# Patient Record
Sex: Female | Born: 1960 | Race: White | Hispanic: No | Marital: Single | Smoking: Never smoker
Health system: Southern US, Community
[De-identification: ages and names within clinical notes are randomized; demographics above are authoritative.]

## PROBLEM LIST (undated history)

## (undated) DIAGNOSIS — J45909 Unspecified asthma, uncomplicated: Secondary | ICD-10-CM

## (undated) DIAGNOSIS — D6851 Activated protein C resistance: Secondary | ICD-10-CM

## (undated) DIAGNOSIS — Q379 Unspecified cleft palate with unilateral cleft lip: Secondary | ICD-10-CM

## (undated) DIAGNOSIS — Z9889 Other specified postprocedural states: Secondary | ICD-10-CM

## (undated) DIAGNOSIS — T8859XA Other complications of anesthesia, initial encounter: Secondary | ICD-10-CM

## (undated) HISTORY — PX: OTHER SURGICAL HISTORY: SHX169

## (undated) HISTORY — PX: MASTECTOMY: SHX3

## (undated) HISTORY — PX: TONSILLECTOMY: SUR1361

---

## 1998-07-29 ENCOUNTER — Ambulatory Visit (HOSPITAL_COMMUNITY): Admission: RE | Admit: 1998-07-29 | Discharge: 1998-07-29 | Payer: Self-pay | Admitting: *Deleted

## 1998-07-29 ENCOUNTER — Encounter: Payer: Self-pay | Admitting: *Deleted

## 1999-12-17 ENCOUNTER — Other Ambulatory Visit: Admission: RE | Admit: 1999-12-17 | Discharge: 1999-12-17 | Payer: Self-pay | Admitting: Obstetrics and Gynecology

## 2000-07-01 ENCOUNTER — Inpatient Hospital Stay (HOSPITAL_COMMUNITY): Admission: AD | Admit: 2000-07-01 | Discharge: 2000-07-04 | Payer: Self-pay | Admitting: Obstetrics and Gynecology

## 2000-07-05 ENCOUNTER — Encounter: Admission: RE | Admit: 2000-07-05 | Discharge: 2000-08-06 | Payer: Self-pay | Admitting: Obstetrics and Gynecology

## 2000-08-11 ENCOUNTER — Other Ambulatory Visit: Admission: RE | Admit: 2000-08-11 | Discharge: 2000-08-11 | Payer: Self-pay | Admitting: Obstetrics and Gynecology

## 2002-12-29 ENCOUNTER — Ambulatory Visit (HOSPITAL_COMMUNITY): Admission: RE | Admit: 2002-12-29 | Discharge: 2002-12-29 | Payer: Self-pay | Admitting: *Deleted

## 2002-12-29 ENCOUNTER — Encounter: Payer: Self-pay | Admitting: *Deleted

## 2004-05-08 ENCOUNTER — Ambulatory Visit (HOSPITAL_BASED_OUTPATIENT_CLINIC_OR_DEPARTMENT_OTHER): Admission: RE | Admit: 2004-05-08 | Discharge: 2004-05-08 | Payer: Self-pay | Admitting: Family Medicine

## 2004-10-14 ENCOUNTER — Other Ambulatory Visit: Admission: RE | Admit: 2004-10-14 | Discharge: 2004-10-14 | Payer: Self-pay | Admitting: Family Medicine

## 2006-06-22 ENCOUNTER — Encounter: Admission: RE | Admit: 2006-06-22 | Discharge: 2006-06-22 | Payer: Self-pay | Admitting: Family Medicine

## 2006-07-01 ENCOUNTER — Encounter: Admission: RE | Admit: 2006-07-01 | Discharge: 2006-07-01 | Payer: Self-pay | Admitting: Family Medicine

## 2007-01-12 ENCOUNTER — Other Ambulatory Visit: Admission: RE | Admit: 2007-01-12 | Discharge: 2007-01-12 | Payer: Self-pay | Admitting: Family Medicine

## 2008-12-12 ENCOUNTER — Encounter: Admission: RE | Admit: 2008-12-12 | Discharge: 2008-12-12 | Payer: Self-pay | Admitting: Family Medicine

## 2008-12-12 ENCOUNTER — Encounter (INDEPENDENT_AMBULATORY_CARE_PROVIDER_SITE_OTHER): Payer: Self-pay | Admitting: Diagnostic Radiology

## 2008-12-20 ENCOUNTER — Encounter: Admission: RE | Admit: 2008-12-20 | Discharge: 2008-12-20 | Payer: Self-pay | Admitting: Family Medicine

## 2008-12-24 ENCOUNTER — Ambulatory Visit: Payer: Self-pay | Admitting: Genetic Counselor

## 2009-01-29 ENCOUNTER — Other Ambulatory Visit: Admission: RE | Admit: 2009-01-29 | Discharge: 2009-01-29 | Payer: Self-pay | Admitting: Family Medicine

## 2009-02-04 ENCOUNTER — Encounter (INDEPENDENT_AMBULATORY_CARE_PROVIDER_SITE_OTHER): Payer: Self-pay | Admitting: Surgery

## 2009-02-04 ENCOUNTER — Ambulatory Visit (HOSPITAL_BASED_OUTPATIENT_CLINIC_OR_DEPARTMENT_OTHER): Admission: RE | Admit: 2009-02-04 | Discharge: 2009-02-05 | Payer: Self-pay | Admitting: Surgery

## 2009-02-14 ENCOUNTER — Emergency Department (HOSPITAL_COMMUNITY): Admission: EM | Admit: 2009-02-14 | Discharge: 2009-02-15 | Payer: Self-pay | Admitting: Emergency Medicine

## 2009-02-20 ENCOUNTER — Encounter: Payer: Self-pay | Admitting: Internal Medicine

## 2009-02-20 ENCOUNTER — Ambulatory Visit: Payer: Self-pay | Admitting: Oncology

## 2009-02-20 LAB — CBC WITH DIFFERENTIAL/PLATELET
Basophils Absolute: 0 10*3/uL (ref 0.0–0.1)
Eosinophils Absolute: 0.1 10*3/uL (ref 0.0–0.5)
HGB: 14.8 g/dL (ref 11.6–15.9)
NEUT#: 4 10*3/uL (ref 1.5–6.5)
RDW: 13.4 % (ref 11.2–14.5)
lymph#: 1.2 10*3/uL (ref 0.9–3.3)

## 2009-02-21 ENCOUNTER — Ambulatory Visit: Payer: Self-pay

## 2009-02-21 ENCOUNTER — Encounter: Payer: Self-pay | Admitting: Oncology

## 2009-02-21 ENCOUNTER — Ambulatory Visit (HOSPITAL_COMMUNITY): Admission: RE | Admit: 2009-02-21 | Discharge: 2009-02-21 | Payer: Self-pay | Admitting: Oncology

## 2009-02-21 LAB — COMPREHENSIVE METABOLIC PANEL
Albumin: 3.9 g/dL (ref 3.5–5.2)
BUN: 9 mg/dL (ref 6–23)
CO2: 27 mEq/L (ref 19–32)
Glucose, Bld: 92 mg/dL (ref 70–99)
Potassium: 3.9 mEq/L (ref 3.5–5.3)
Sodium: 143 mEq/L (ref 135–145)
Total Bilirubin: 0.5 mg/dL (ref 0.3–1.2)
Total Protein: 6.5 g/dL (ref 6.0–8.3)

## 2009-02-21 LAB — LACTATE DEHYDROGENASE: LDH: 177 U/L (ref 94–250)

## 2009-02-21 LAB — VITAMIN D 25 HYDROXY (VIT D DEFICIENCY, FRACTURES): Vit D, 25-Hydroxy: 27 ng/mL — ABNORMAL LOW (ref 30–89)

## 2009-02-21 LAB — CANCER ANTIGEN 27.29: CA 27.29: 19 U/mL (ref 0–39)

## 2009-03-08 LAB — HYPERCOAGULABLE PANEL, COMPREHENSIVE RET.
AntiThromb III Func: 95 % (ref 76–126)
Beta-2 Glyco I IgG: <4 U/mL (ref <20)
Beta-2-Glycoprotein I IgA: 13 U/mL (ref <10)
Beta-2-Glycoprotein I IgM: <4 U/mL (ref <10)
Homocysteine: 10.4 umol/L (ref 4.0–15.4)
PTT Lupus Anticoagulant: 47 secs (ref 36.3–48.8)
Protein S Activity: 62 % — ABNORMAL LOW (ref 69–129)
Protein S Ag, Total: 78 % (ref 70–140)

## 2009-03-08 LAB — LIPID PANEL
Cholesterol: 156 mg/dL (ref 0–200)
LDL Cholesterol: 81 mg/dL (ref 0–99)
Triglycerides: 38 mg/dL (ref <150)

## 2009-03-11 ENCOUNTER — Ambulatory Visit (HOSPITAL_BASED_OUTPATIENT_CLINIC_OR_DEPARTMENT_OTHER): Admission: RE | Admit: 2009-03-11 | Discharge: 2009-03-11 | Payer: Self-pay | Admitting: Surgery

## 2009-03-21 LAB — URINALYSIS, MICROSCOPIC - CHCC
Bilirubin (Urine): NEGATIVE
Ketones: NEGATIVE mg/dL
Leukocyte Esterase: NEGATIVE
pH: 6 (ref 4.6–8.0)

## 2009-03-21 LAB — CBC WITH DIFFERENTIAL/PLATELET
Basophils Absolute: 0 10*3/uL (ref 0.0–0.1)
EOS%: 12.7 % — ABNORMAL HIGH (ref 0.0–7.0)
HCT: 39.8 % (ref 34.8–46.6)
HGB: 13.9 g/dL (ref 11.6–15.9)
MCH: 33.1 pg (ref 25.1–34.0)
MCV: 94.8 fL (ref 79.5–101.0)
NEUT%: 29.9 % — ABNORMAL LOW (ref 38.4–76.8)
Platelets: 127 10*3/uL — ABNORMAL LOW (ref 145–400)
lymph#: 0.7 10*3/uL — ABNORMAL LOW (ref 0.9–3.3)

## 2009-03-23 LAB — URINE CULTURE

## 2009-03-26 ENCOUNTER — Ambulatory Visit: Payer: Self-pay | Admitting: Oncology

## 2009-03-28 LAB — CBC WITH DIFFERENTIAL/PLATELET
BASO%: 0.4 % (ref 0.0–2.0)
LYMPH%: 29.4 % (ref 14.0–49.7)
MCHC: 35.1 g/dL (ref 31.5–36.0)
MONO#: 0.5 10*3/uL (ref 0.1–0.9)
MONO%: 9.2 % (ref 0.0–14.0)
NEUT#: 3.2 10*3/uL (ref 1.5–6.5)
Platelets: 294 10*3/uL (ref 145–400)
RBC: 3.89 10*6/uL (ref 3.70–5.45)
RDW: 13.4 % (ref 11.2–14.5)
WBC: 5.4 10*3/uL (ref 3.9–10.3)

## 2009-04-04 LAB — CBC WITH DIFFERENTIAL/PLATELET
BASO%: 1 % (ref 0.0–2.0)
Eosinophils Absolute: 0.1 10*3/uL (ref 0.0–0.5)
MCHC: 34.6 g/dL (ref 31.5–36.0)
MONO#: 0.2 10*3/uL (ref 0.1–0.9)
NEUT#: 1.4 10*3/uL — ABNORMAL LOW (ref 1.5–6.5)
RBC: 3.6 10*6/uL — ABNORMAL LOW (ref 3.70–5.45)
WBC: 2.6 10*3/uL — ABNORMAL LOW (ref 3.9–10.3)
lymph#: 0.9 10*3/uL (ref 0.9–3.3)

## 2009-04-11 LAB — CBC WITH DIFFERENTIAL/PLATELET
BASO%: 1.5 % (ref 0.0–2.0)
LYMPH%: 20.8 % (ref 14.0–49.7)
MCHC: 35.1 g/dL (ref 31.5–36.0)
MONO#: 1 10*3/uL — ABNORMAL HIGH (ref 0.1–0.9)
Platelets: 232 10*3/uL (ref 145–400)
RBC: 4.05 10*6/uL (ref 3.70–5.45)
RDW: 13.6 % (ref 11.2–14.5)
WBC: 9 10*3/uL (ref 3.9–10.3)
lymph#: 1.9 10*3/uL (ref 0.9–3.3)

## 2009-04-11 LAB — COMPREHENSIVE METABOLIC PANEL
ALT: 32 U/L (ref 0–35)
Albumin: 3.6 g/dL (ref 3.5–5.2)
CO2: 28 mEq/L (ref 19–32)
Calcium: 9 mg/dL (ref 8.4–10.5)
Chloride: 107 mEq/L (ref 96–112)
Potassium: 4.1 mEq/L (ref 3.5–5.3)
Sodium: 141 mEq/L (ref 135–145)
Total Protein: 6.3 g/dL (ref 6.0–8.3)

## 2009-04-18 ENCOUNTER — Ambulatory Visit: Payer: Self-pay | Admitting: Oncology

## 2009-04-18 LAB — CBC WITH DIFFERENTIAL/PLATELET
BASO%: 0.7 % (ref 0.0–2.0)
HCT: 36.2 % (ref 34.8–46.6)
MCHC: 35.1 g/dL (ref 31.5–36.0)
MONO#: 0.3 10*3/uL (ref 0.1–0.9)
NEUT#: 4.4 10*3/uL (ref 1.5–6.5)
NEUT%: 75.6 % (ref 38.4–76.8)
WBC: 5.8 10*3/uL (ref 3.9–10.3)
lymph#: 1.1 10*3/uL (ref 0.9–3.3)
nRBC: 0 % (ref 0–0)

## 2009-04-25 LAB — CBC WITH DIFFERENTIAL/PLATELET
BASO%: 1.9 % (ref 0.0–2.0)
EOS%: 0.3 % (ref 0.0–7.0)
MCH: 33.1 pg (ref 25.1–34.0)
MCHC: 34.9 g/dL (ref 31.5–36.0)
NEUT%: 65.3 % (ref 38.4–76.8)
RBC: 3.9 10*6/uL (ref 3.70–5.45)
RDW: 15.4 % — ABNORMAL HIGH (ref 11.2–14.5)
lymph#: 1.3 10*3/uL (ref 0.9–3.3)

## 2009-04-25 LAB — COMPREHENSIVE METABOLIC PANEL
ALT: 33 U/L (ref 0–35)
AST: 30 U/L (ref 0–37)
Calcium: 8.9 mg/dL (ref 8.4–10.5)
Chloride: 108 mEq/L (ref 96–112)
Creatinine, Ser: 0.69 mg/dL (ref 0.40–1.20)
Potassium: 3.8 mEq/L (ref 3.5–5.3)
Sodium: 138 mEq/L (ref 135–145)

## 2009-05-02 LAB — CBC WITH DIFFERENTIAL/PLATELET
BASO%: 1.9 % (ref 0.0–2.0)
HCT: 36.6 % (ref 34.8–46.6)
MCHC: 35.2 g/dL (ref 31.5–36.0)
MONO#: 0.1 10*3/uL (ref 0.1–0.9)
RBC: 3.89 10*6/uL (ref 3.70–5.45)
RDW: 15.2 % — ABNORMAL HIGH (ref 11.2–14.5)
WBC: 2.2 10*3/uL — ABNORMAL LOW (ref 3.9–10.3)
lymph#: 0.8 10*3/uL — ABNORMAL LOW (ref 0.9–3.3)

## 2009-05-09 LAB — COMPREHENSIVE METABOLIC PANEL
ALT: 30 U/L (ref 0–35)
Albumin: 3.5 g/dL (ref 3.5–5.2)
BUN: 10 mg/dL (ref 6–23)
CO2: 28 mEq/L (ref 19–32)
Calcium: 9.1 mg/dL (ref 8.4–10.5)
Chloride: 104 mEq/L (ref 96–112)
Creatinine, Ser: 0.65 mg/dL (ref 0.40–1.20)
Potassium: 3.7 mEq/L (ref 3.5–5.3)

## 2009-05-09 LAB — CBC WITH DIFFERENTIAL/PLATELET
Basophils Absolute: 0.1 10*3/uL (ref 0.0–0.1)
HCT: 34.3 % — ABNORMAL LOW (ref 34.8–46.6)
HGB: 11.8 g/dL (ref 11.6–15.9)
MONO#: 0.7 10*3/uL (ref 0.1–0.9)
NEUT#: 3.9 10*3/uL (ref 1.5–6.5)
NEUT%: 68.4 % (ref 38.4–76.8)
WBC: 5.7 10*3/uL (ref 3.9–10.3)
lymph#: 0.9 10*3/uL (ref 0.9–3.3)

## 2009-05-14 ENCOUNTER — Ambulatory Visit: Payer: Self-pay | Admitting: Oncology

## 2009-05-16 LAB — CBC WITH DIFFERENTIAL/PLATELET
BASO%: 1.8 % (ref 0.0–2.0)
EOS%: 1.1 % (ref 0.0–7.0)
HCT: 35.2 % (ref 34.8–46.6)
MCHC: 34.4 g/dL (ref 31.5–36.0)
MONO#: 0.6 10*3/uL (ref 0.1–0.9)
NEUT%: 63 % (ref 38.4–76.8)
RDW: 16.8 % — ABNORMAL HIGH (ref 11.2–14.5)
WBC: 4.5 10*3/uL (ref 3.9–10.3)
lymph#: 1 10*3/uL (ref 0.9–3.3)
nRBC: 0 % (ref 0–0)

## 2009-05-23 ENCOUNTER — Encounter: Admission: RE | Admit: 2009-05-23 | Discharge: 2009-05-23 | Payer: Self-pay | Admitting: Oncology

## 2009-05-23 LAB — CBC WITH DIFFERENTIAL/PLATELET
BASO%: 3.2 % — ABNORMAL HIGH (ref 0.0–2.0)
Basophils Absolute: 0.1 10*3/uL (ref 0.0–0.1)
EOS%: 0.9 % (ref 0.0–7.0)
HGB: 12.5 g/dL (ref 11.6–15.9)
MCH: 34.1 pg — ABNORMAL HIGH (ref 25.1–34.0)
MCHC: 34.9 g/dL (ref 31.5–36.0)
MCV: 97.5 fL (ref 79.5–101.0)
MONO%: 11.3 % (ref 0.0–14.0)
RBC: 3.67 10*6/uL — ABNORMAL LOW (ref 3.70–5.45)
RDW: 17.4 % — ABNORMAL HIGH (ref 11.2–14.5)
lymph#: 1.1 10*3/uL (ref 0.9–3.3)

## 2009-05-30 LAB — CBC WITH DIFFERENTIAL/PLATELET
Basophils Absolute: 0.1 10*3/uL (ref 0.0–0.1)
Eosinophils Absolute: 0 10*3/uL (ref 0.0–0.5)
HCT: 34.9 % (ref 34.8–46.6)
HGB: 12.4 g/dL (ref 11.6–15.9)
LYMPH%: 28.2 % (ref 14.0–49.7)
MCV: 100.1 fL (ref 79.5–101.0)
MONO#: 0.3 10*3/uL (ref 0.1–0.9)
MONO%: 9.2 % (ref 0.0–14.0)
NEUT#: 2.1 10*3/uL (ref 1.5–6.5)
NEUT%: 60 % (ref 38.4–76.8)
Platelets: 259 10*3/uL (ref 145–400)

## 2009-05-31 LAB — COMPREHENSIVE METABOLIC PANEL
Alkaline Phosphatase: 55 U/L (ref 39–117)
BUN: 12 mg/dL (ref 6–23)
CO2: 26 mEq/L (ref 19–32)
Glucose, Bld: 89 mg/dL (ref 70–99)
Total Bilirubin: 0.3 mg/dL (ref 0.3–1.2)

## 2009-06-06 ENCOUNTER — Ambulatory Visit (HOSPITAL_COMMUNITY): Admission: RE | Admit: 2009-06-06 | Discharge: 2009-06-06 | Payer: Self-pay | Admitting: Oncology

## 2009-06-06 LAB — CBC WITH DIFFERENTIAL/PLATELET
Basophils Absolute: 0.1 10*3/uL (ref 0.0–0.1)
Eosinophils Absolute: 0.2 10*3/uL (ref 0.0–0.5)
HGB: 12.5 g/dL (ref 11.6–15.9)
MCV: 98.1 fL (ref 79.5–101.0)
MONO#: 0.4 10*3/uL (ref 0.1–0.9)
MONO%: 10.4 % (ref 0.0–14.0)
NEUT#: 2.3 10*3/uL (ref 1.5–6.5)
Platelets: 240 10*3/uL (ref 145–400)
RBC: 3.62 10*6/uL — ABNORMAL LOW (ref 3.70–5.45)
RDW: 16.4 % — ABNORMAL HIGH (ref 11.2–14.5)
WBC: 4.2 10*3/uL (ref 3.9–10.3)

## 2009-06-13 ENCOUNTER — Ambulatory Visit: Payer: Self-pay | Admitting: Oncology

## 2009-06-13 LAB — CBC WITH DIFFERENTIAL/PLATELET
Basophils Absolute: 0.1 10*3/uL (ref 0.0–0.1)
Eosinophils Absolute: 0.2 10*3/uL (ref 0.0–0.5)
HCT: 36.8 % (ref 34.8–46.6)
LYMPH%: 34.8 % (ref 14.0–49.7)
MCV: 98.9 fL (ref 79.5–101.0)
MONO#: 0.4 10*3/uL (ref 0.1–0.9)
MONO%: 10.2 % (ref 0.0–14.0)
NEUT#: 1.8 10*3/uL (ref 1.5–6.5)
NEUT%: 47.8 % (ref 38.4–76.8)
Platelets: 251 10*3/uL (ref 145–400)
RBC: 3.72 10*6/uL (ref 3.70–5.45)
WBC: 3.7 10*3/uL — ABNORMAL LOW (ref 3.9–10.3)

## 2009-06-13 LAB — COMPREHENSIVE METABOLIC PANEL
ALT: 69 U/L — ABNORMAL HIGH (ref 0–35)
BUN: 11 mg/dL (ref 6–23)
CO2: 27 mEq/L (ref 19–32)
Calcium: 9 mg/dL (ref 8.4–10.5)
Chloride: 109 mEq/L (ref 96–112)
Creatinine, Ser: 0.51 mg/dL (ref 0.40–1.20)
Glucose, Bld: 94 mg/dL (ref 70–99)
Total Bilirubin: 0.5 mg/dL (ref 0.3–1.2)

## 2009-06-20 LAB — CBC WITH DIFFERENTIAL/PLATELET
BASO%: 1.7 % (ref 0.0–2.0)
Eosinophils Absolute: 0.2 10*3/uL (ref 0.0–0.5)
HCT: 35.5 % (ref 34.8–46.6)
HGB: 12.4 g/dL (ref 11.6–15.9)
MCHC: 34.9 g/dL (ref 31.5–36.0)
MONO#: 0.3 10*3/uL (ref 0.1–0.9)
NEUT#: 1.8 10*3/uL (ref 1.5–6.5)
NEUT%: 49.1 % (ref 38.4–76.8)
Platelets: 214 10*3/uL (ref 145–400)
WBC: 3.6 10*3/uL — ABNORMAL LOW (ref 3.9–10.3)
lymph#: 1.3 10*3/uL (ref 0.9–3.3)

## 2009-06-27 LAB — CBC WITH DIFFERENTIAL/PLATELET
BASO%: 1.7 % (ref 0.0–2.0)
EOS%: 6.5 % (ref 0.0–7.0)
LYMPH%: 33.6 % (ref 14.0–49.7)
MCHC: 35.8 g/dL (ref 31.5–36.0)
MONO#: 0.3 10*3/uL (ref 0.1–0.9)
MONO%: 8.2 % (ref 0.0–14.0)
Platelets: 239 10*3/uL (ref 145–400)
RBC: 3.39 10*6/uL — ABNORMAL LOW (ref 3.70–5.45)
WBC: 3.4 10*3/uL — ABNORMAL LOW (ref 3.9–10.3)

## 2009-07-02 ENCOUNTER — Ambulatory Visit: Admission: RE | Admit: 2009-07-02 | Discharge: 2009-09-27 | Payer: Self-pay | Admitting: Radiation Oncology

## 2009-07-04 LAB — CBC WITH DIFFERENTIAL/PLATELET
BASO%: 2.9 % — ABNORMAL HIGH (ref 0.0–2.0)
LYMPH%: 32.1 % (ref 14.0–49.7)
MCHC: 34.6 g/dL (ref 31.5–36.0)
MONO#: 0.4 10*3/uL (ref 0.1–0.9)
RBC: 3.47 10*6/uL — ABNORMAL LOW (ref 3.70–5.45)
WBC: 3.4 10*3/uL — ABNORMAL LOW (ref 3.9–10.3)
lymph#: 1.1 10*3/uL (ref 0.9–3.3)
nRBC: 0 % (ref 0–0)

## 2009-07-10 LAB — CBC WITH DIFFERENTIAL/PLATELET
Basophils Absolute: 0 10*3/uL (ref 0.0–0.1)
EOS%: 2.1 % (ref 0.0–7.0)
HGB: 12.7 g/dL (ref 11.6–15.9)
MCH: 36 pg — ABNORMAL HIGH (ref 25.1–34.0)
NEUT#: 1.7 10*3/uL (ref 1.5–6.5)
RBC: 3.53 10*6/uL — ABNORMAL LOW (ref 3.70–5.45)
RDW: 13.4 % (ref 11.2–14.5)
lymph#: 0.8 10*3/uL — ABNORMAL LOW (ref 0.9–3.3)

## 2009-07-16 ENCOUNTER — Ambulatory Visit: Payer: Self-pay | Admitting: Oncology

## 2009-07-18 ENCOUNTER — Ambulatory Visit (HOSPITAL_COMMUNITY): Admission: RE | Admit: 2009-07-18 | Discharge: 2009-07-18 | Payer: Self-pay | Admitting: Oncology

## 2009-07-18 LAB — CBC WITH DIFFERENTIAL/PLATELET
BASO%: 1 % (ref 0.0–2.0)
EOS%: 4 % (ref 0.0–7.0)
LYMPH%: 39.1 % (ref 14.0–49.7)
MCH: 35.7 pg — ABNORMAL HIGH (ref 25.1–34.0)
MCHC: 34.4 g/dL (ref 31.5–36.0)
MCV: 103.6 fL — ABNORMAL HIGH (ref 79.5–101.0)
MONO%: 13.4 % (ref 0.0–14.0)
NEUT#: 1.3 10*3/uL — ABNORMAL LOW (ref 1.5–6.5)
Platelets: 254 10*3/uL (ref 145–400)
RBC: 3.59 10*6/uL — ABNORMAL LOW (ref 3.70–5.45)
RDW: 13.6 % (ref 11.2–14.5)

## 2009-07-18 LAB — COMPREHENSIVE METABOLIC PANEL
ALT: 52 U/L — ABNORMAL HIGH (ref 0–35)
AST: 35 U/L (ref 0–37)
Albumin: 4.1 g/dL (ref 3.5–5.2)
BUN: 11 mg/dL (ref 6–23)
Calcium: 9.3 mg/dL (ref 8.4–10.5)
Chloride: 109 mEq/L (ref 96–112)
Potassium: 4.4 mEq/L (ref 3.5–5.3)
Sodium: 141 mEq/L (ref 135–145)
Total Protein: 6.3 g/dL (ref 6.0–8.3)

## 2009-07-25 LAB — CBC WITH DIFFERENTIAL/PLATELET
BASO%: 0.9 % (ref 0.0–2.0)
Eosinophils Absolute: 0.2 10*3/uL (ref 0.0–0.5)
MCHC: 34.7 g/dL (ref 31.5–36.0)
MONO#: 0.8 10*3/uL (ref 0.1–0.9)
MONO%: 18.4 % — ABNORMAL HIGH (ref 0.0–14.0)
NEUT#: 1.9 10*3/uL (ref 1.5–6.5)
RBC: 3.97 10*6/uL (ref 3.70–5.45)
RDW: 12.7 % (ref 11.2–14.5)
WBC: 4.3 10*3/uL (ref 3.9–10.3)
nRBC: 0 % (ref 0–0)

## 2009-08-07 ENCOUNTER — Encounter: Admission: RE | Admit: 2009-08-07 | Discharge: 2009-09-18 | Payer: Self-pay | Admitting: Oncology

## 2009-09-29 ENCOUNTER — Ambulatory Visit: Admission: RE | Admit: 2009-09-29 | Discharge: 2009-10-04 | Payer: Self-pay | Admitting: Radiation Oncology

## 2009-09-30 ENCOUNTER — Ambulatory Visit: Payer: Self-pay | Admitting: Oncology

## 2009-09-30 LAB — CBC WITH DIFFERENTIAL/PLATELET
BASO%: 0.5 % (ref 0.0–2.0)
LYMPH%: 20.9 % (ref 14.0–49.7)
MCHC: 34.9 g/dL (ref 31.5–36.0)
MCV: 97.5 fL (ref 79.5–101.0)
MONO#: 0.7 10*3/uL (ref 0.1–0.9)
MONO%: 14.2 % — ABNORMAL HIGH (ref 0.0–14.0)
NEUT#: 2.9 10*3/uL (ref 1.5–6.5)
NEUT%: 61.9 % (ref 38.4–76.8)
WBC: 4.7 10*3/uL (ref 3.9–10.3)
lymph#: 1 10*3/uL (ref 0.9–3.3)

## 2009-09-30 LAB — CANCER ANTIGEN 19-9: CA 19-9: 18.4 U/mL (ref <35.0)

## 2009-09-30 LAB — CANCER ANTIGEN 27.29: CA 27.29: 7 U/mL (ref 0–39)

## 2009-09-30 LAB — COMPREHENSIVE METABOLIC PANEL
BUN: 10 mg/dL (ref 6–23)
Chloride: 102 mEq/L (ref 96–112)
Potassium: 4.1 mEq/L (ref 3.5–5.3)
Total Protein: 6.9 g/dL (ref 6.0–8.3)

## 2009-11-11 ENCOUNTER — Other Ambulatory Visit: Admission: RE | Admit: 2009-11-11 | Discharge: 2009-11-11 | Payer: Self-pay | Admitting: Family Medicine

## 2009-11-19 ENCOUNTER — Ambulatory Visit (HOSPITAL_BASED_OUTPATIENT_CLINIC_OR_DEPARTMENT_OTHER): Admission: RE | Admit: 2009-11-19 | Discharge: 2009-11-19 | Payer: Self-pay | Admitting: Surgery

## 2010-02-12 ENCOUNTER — Ambulatory Visit: Payer: Self-pay | Admitting: Oncology

## 2010-02-13 ENCOUNTER — Ambulatory Visit (HOSPITAL_COMMUNITY): Admission: RE | Admit: 2010-02-13 | Discharge: 2010-02-13 | Payer: Self-pay | Admitting: Oncology

## 2010-02-13 LAB — CBC WITH DIFFERENTIAL/PLATELET
BASO%: 0.6 % (ref 0.0–2.0)
EOS%: 3.3 % (ref 0.0–7.0)
HGB: 15.4 g/dL (ref 11.6–15.9)
MCH: 33.5 pg (ref 25.1–34.0)
MCHC: 34.6 g/dL (ref 31.5–36.0)
MCV: 96.6 fL (ref 79.5–101.0)
MONO%: 8 % (ref 0.0–14.0)
RBC: 4.61 10*6/uL (ref 3.70–5.45)
RDW: 13 % (ref 11.2–14.5)
lymph#: 1.6 10*3/uL (ref 0.9–3.3)

## 2010-02-13 LAB — COMPREHENSIVE METABOLIC PANEL
ALT: 53 U/L — ABNORMAL HIGH (ref 0–35)
AST: 46 U/L — ABNORMAL HIGH (ref 0–37)
Albumin: 4.1 g/dL (ref 3.5–5.2)
Alkaline Phosphatase: 85 U/L (ref 39–117)
BUN: 16 mg/dL (ref 6–23)
Calcium: 9.6 mg/dL (ref 8.4–10.5)
Chloride: 108 mEq/L (ref 96–112)
Potassium: 3.9 mEq/L (ref 3.5–5.3)
Sodium: 142 mEq/L (ref 135–145)

## 2010-02-19 IMAGING — CR DG CHEST 1V PORT
1 series · 1 of 1 positions shown · non-contrast
Comparison: 02/14/2009

CLINICAL DATA: Breast cancer.  Status post Port-A-Cath placement.

PORTABLE CHEST - 1 VIEW

[view not recorded]
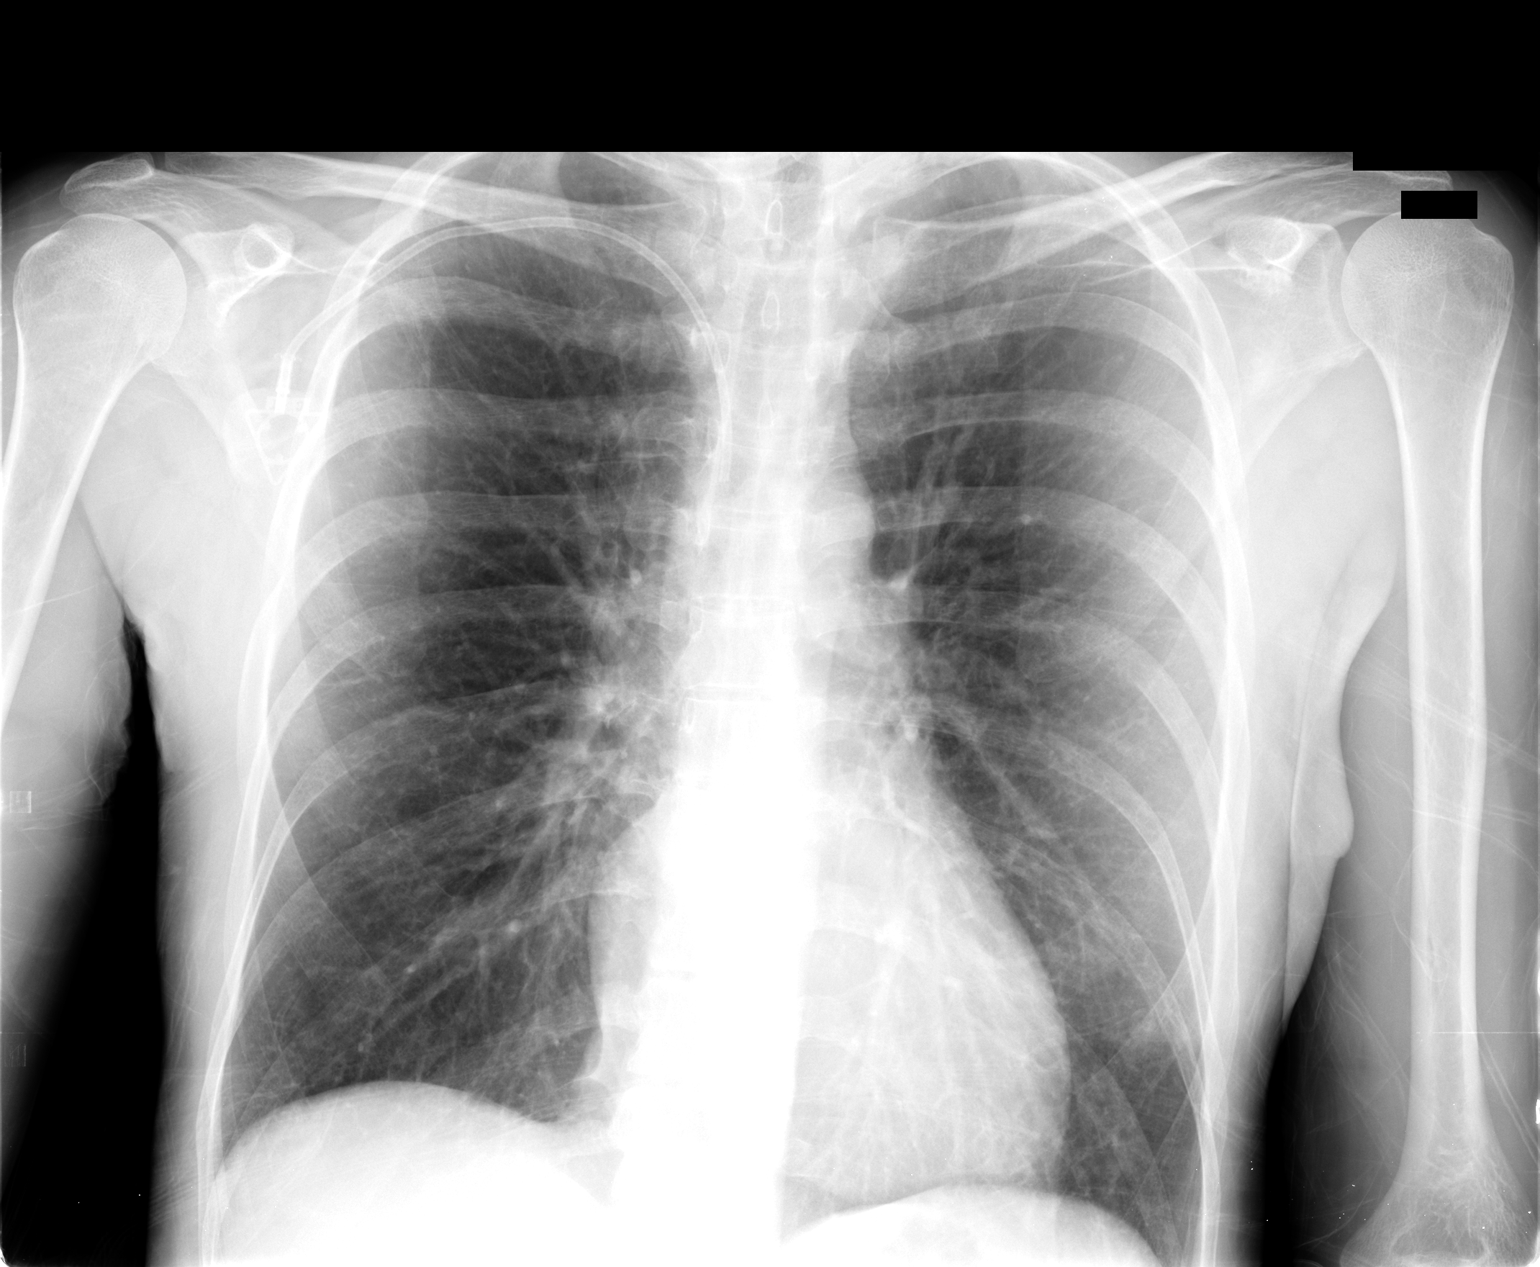

[1 of 1 positions shown; findings below may reference images not displayed]

FINDINGS: A new right-sided Port-A-Cath is seen with tip in the
SVC.  There is no evidence of pneumothorax.

Both lungs are clear.  Heart size and mediastinal contours are
normal.
IMPRESSION: Right-sided Port-A-Cath in appropriate position.  No evidence of
pneumothorax or other acute findings.

## 2010-05-13 ENCOUNTER — Ambulatory Visit: Payer: Self-pay | Admitting: Oncology

## 2010-05-13 LAB — COMPREHENSIVE METABOLIC PANEL
AST: 35 U/L (ref 0–37)
Albumin: 4.2 g/dL (ref 3.5–5.2)
Alkaline Phosphatase: 87 U/L (ref 39–117)
BUN: 11 mg/dL (ref 6–23)
Calcium: 9.5 mg/dL (ref 8.4–10.5)
Chloride: 105 mEq/L (ref 96–112)
Potassium: 4.3 mEq/L (ref 3.5–5.3)
Sodium: 140 mEq/L (ref 135–145)
Total Protein: 6.5 g/dL (ref 6.0–8.3)

## 2010-05-13 LAB — CBC WITH DIFFERENTIAL/PLATELET
BASO%: 0.7 % (ref 0.0–2.0)
Basophils Absolute: 0 10*3/uL (ref 0.0–0.1)
EOS%: 0.8 % (ref 0.0–7.0)
Eosinophils Absolute: 0 10*3/uL (ref 0.0–0.5)
LYMPH%: 30.1 % (ref 14.0–49.7)
MCH: 33.7 pg (ref 25.1–34.0)
MONO#: 0.4 10*3/uL (ref 0.1–0.9)
MONO%: 7.1 % (ref 0.0–14.0)
NEUT#: 3.2 10*3/uL (ref 1.5–6.5)
Platelets: 220 10*3/uL (ref 145–400)
RBC: 4.19 10*6/uL (ref 3.70–5.45)
WBC: 5.2 10*3/uL (ref 3.9–10.3)

## 2010-08-07 ENCOUNTER — Ambulatory Visit: Payer: Self-pay | Admitting: Oncology

## 2010-08-11 LAB — CBC WITH DIFFERENTIAL/PLATELET
Basophils Absolute: 0 10*3/uL (ref 0.0–0.1)
EOS%: 1.7 % (ref 0.0–7.0)
Eosinophils Absolute: 0.1 10*3/uL (ref 0.0–0.5)
LYMPH%: 30.9 % (ref 14.0–49.7)
MCH: 34 pg (ref 25.1–34.0)
MCV: 97.4 fL (ref 79.5–101.0)
MONO%: 6.8 % (ref 0.0–14.0)
NEUT#: 4.2 10*3/uL (ref 1.5–6.5)
Platelets: 248 10*3/uL (ref 145–400)
RBC: 4.22 10*6/uL (ref 3.70–5.45)
RDW: 12.9 % (ref 11.2–14.5)

## 2010-08-11 LAB — COMPREHENSIVE METABOLIC PANEL
AST: 35 U/L (ref 0–37)
Alkaline Phosphatase: 83 U/L (ref 39–117)
BUN: 16 mg/dL (ref 6–23)
Glucose, Bld: 92 mg/dL (ref 70–99)
Sodium: 141 mEq/L (ref 135–145)
Total Bilirubin: 0.3 mg/dL (ref 0.3–1.2)

## 2010-10-18 ENCOUNTER — Other Ambulatory Visit: Payer: Self-pay | Admitting: Oncology

## 2010-10-18 DIAGNOSIS — C50919 Malignant neoplasm of unspecified site of unspecified female breast: Secondary | ICD-10-CM

## 2010-10-18 DIAGNOSIS — K869 Disease of pancreas, unspecified: Secondary | ICD-10-CM

## 2010-10-20 ENCOUNTER — Encounter: Payer: Self-pay | Admitting: Surgery

## 2010-11-11 ENCOUNTER — Other Ambulatory Visit: Payer: Self-pay | Admitting: Oncology

## 2010-11-11 ENCOUNTER — Encounter (HOSPITAL_BASED_OUTPATIENT_CLINIC_OR_DEPARTMENT_OTHER): Payer: PRIVATE HEALTH INSURANCE | Admitting: Oncology

## 2010-11-11 DIAGNOSIS — C50419 Malignant neoplasm of upper-outer quadrant of unspecified female breast: Secondary | ICD-10-CM

## 2010-11-11 DIAGNOSIS — D6859 Other primary thrombophilia: Secondary | ICD-10-CM

## 2010-11-11 LAB — CBC WITH DIFFERENTIAL/PLATELET
BASO%: 0.8 % (ref 0.0–2.0)
EOS%: 2.6 % (ref 0.0–7.0)
Eosinophils Absolute: 0.1 10*3/uL (ref 0.0–0.5)
HGB: 14.8 g/dL (ref 11.6–15.9)
LYMPH%: 38.3 % (ref 14.0–49.7)
MCV: 99 fL (ref 79.5–101.0)
MONO#: 0.5 10*3/uL (ref 0.1–0.9)
NEUT#: 2.6 10*3/uL (ref 1.5–6.5)
NEUT%: 49.2 % (ref 38.4–76.8)

## 2010-11-11 LAB — COMPREHENSIVE METABOLIC PANEL
ALT: 46 U/L — ABNORMAL HIGH (ref 0–35)
CO2: 24 mEq/L (ref 19–32)
Chloride: 107 mEq/L (ref 96–112)
Creatinine, Ser: 0.7 mg/dL (ref 0.40–1.20)
Potassium: 4.2 mEq/L (ref 3.5–5.3)
Sodium: 143 mEq/L (ref 135–145)
Total Bilirubin: 0.4 mg/dL (ref 0.3–1.2)

## 2010-11-18 ENCOUNTER — Encounter (HOSPITAL_BASED_OUTPATIENT_CLINIC_OR_DEPARTMENT_OTHER): Payer: PRIVATE HEALTH INSURANCE | Admitting: Oncology

## 2010-11-18 DIAGNOSIS — C50419 Malignant neoplasm of upper-outer quadrant of unspecified female breast: Secondary | ICD-10-CM

## 2010-11-18 DIAGNOSIS — D6859 Other primary thrombophilia: Secondary | ICD-10-CM

## 2010-12-17 LAB — POCT HEMOGLOBIN-HEMACUE: Hemoglobin: 14.4 g/dL (ref 12.0–15.0)

## 2011-01-05 LAB — POCT HEMOGLOBIN-HEMACUE: Hemoglobin: 14.5 g/dL (ref 12.0–15.0)

## 2011-01-06 LAB — DIFFERENTIAL
Basophils Absolute: 0 10*3/uL (ref 0.0–0.1)
Basophils Relative: 0 % (ref 0–1)
Eosinophils Absolute: 0.1 10*3/uL (ref 0.0–0.7)
Eosinophils Relative: 1 % (ref 0–5)
Lymphocytes Relative: 11 % — ABNORMAL LOW (ref 12–46)
Monocytes Absolute: 1 10*3/uL (ref 0.1–1.0)

## 2011-01-06 LAB — CBC
HCT: 43.8 % (ref 36.0–46.0)
Hemoglobin: 15.3 g/dL — ABNORMAL HIGH (ref 12.0–15.0)
MCHC: 35 g/dL (ref 30.0–36.0)
MCV: 97.4 fL (ref 78.0–100.0)
RDW: 13 % (ref 11.5–15.5)

## 2011-01-06 LAB — POCT I-STAT, CHEM 8
BUN: 13 mg/dL (ref 6–23)
Calcium, Ion: 1.1 mmol/L — ABNORMAL LOW (ref 1.12–1.32)
Hemoglobin: 15.3 g/dL — ABNORMAL HIGH (ref 12.0–15.0)
TCO2: 25 mmol/L (ref 0–100)

## 2011-01-06 LAB — RAPID URINE DRUG SCREEN, HOSP PERFORMED
Amphetamines: NOT DETECTED
Barbiturates: NOT DETECTED

## 2011-02-09 ENCOUNTER — Ambulatory Visit
Admission: RE | Admit: 2011-02-09 | Discharge: 2011-02-09 | Disposition: A | Discharge status: Home / Self Care | Payer: PRIVATE HEALTH INSURANCE | Source: Ambulatory Visit | Attending: Oncology | Admitting: Oncology

## 2011-02-09 ENCOUNTER — Ambulatory Visit: Payer: Self-pay

## 2011-02-09 DIAGNOSIS — C50919 Malignant neoplasm of unspecified site of unspecified female breast: Secondary | ICD-10-CM

## 2011-02-09 DIAGNOSIS — K869 Disease of pancreas, unspecified: Secondary | ICD-10-CM

## 2011-02-09 MED ORDER — GADOBENATE DIMEGLUMINE 529 MG/ML IV SOLN: 12.0000 mL | Freq: Once | INTRAVENOUS | Status: DC | PRN

## 2011-02-10 NOTE — Op Note (Signed)
Elizabeth Dudley, Elizabeth Dudley               ACCOUNT NO.:  [id number]   MEDICAL RECORD NO.:  [id number]          PATIENT TYPE:  AMB   LOCATION:  DSC                          FACILITY:  [facility]   PHYSICIAN:  Thomas A. Cornett, M.D.DATE OF BIRTH:  1961/03/27   DATE OF PROCEDURE:  02/04/2009  DATE OF DISCHARGE:  02/04/2009                               OPERATIVE REPORT   PREOPERATIVE DIAGNOSIS:  1. Left breast cancer.  Strong family history of breast cancer.  2. History of bilateral breast implants.   POSTOPERATIVE DIAGNOSES:  1. Left breast cancer.  Strong family history of breast cancer.  2. History of bilateral breast implants.   PROCEDURES:  1. Left bilateral simple mastectomy.  2. Explantation of bilateral breast saline implants.  3. Left sentinel lymph node mapping with injection of methylene blue      dye.   SURGEON:  Maisie Fus A. Cornett, MD   ASSISTANT:  Angelia Mould. Derrell Lolling, MD   ANESTHESIA:  LMA.   ESTIMATED BLOOD LOSS:  50 mL.   SPECIMENS:  1. Left breast with tumor.  2. Four left axillary sentinel lymph nodes negative by touch prep.  3. Right breast.  4. Bilateral saline implants.   DRAINS:  #19 Blake drain to each side.   INDICATIONS FOR PROCEDURE:  The patient is a 50 year old female with a  strong family history of breast cancer, recently diagnosed with left  breast cancer.  She underwent genetic screening, which was negative for  any mutations in BRCA1 and 2, but she elected to undergo bilateral  mastectomies, given her strong family history and concern for  recurrence.  She also wished to have both implants removed at the same  time.  She presents for the above.   DESCRIPTION OF PROCEDURE:  The patient was brought to the operating room  after injection of technetium sulfur colloid by the radiologist.  After  induction of general anesthesia, the both breasts were prepped and  draped in sterile fashion.  The unaffected side was done first.  This  was the right side.   Curvilinear incisions were made above and below the  nipple areolar complex and superior and inferior skin flaps were created  up to the clavicle and down to the inframammary fold.  She had  significant scarring involving her inferior mammary fold from placement  of her implants.  Once we were able to develop these flaps, we then  removed the breast off the pectoralis major muscle in a medial to  lateral fashion.  The implants were easily seen.  We opened the  pectoralis major muscle, opened the capsule, and removed the right  implant, which was felt to be silicone and it was intact.  There was no  evidence of any rupture.  We then excised the capsule.  Once we had done  this side, we inspected for hemostasis, found it to be excellent and  packed it.   We covered the right side with a green towel.  Left side was then done.  Curvilinear incisions were made above and below the nipple areolar  complex in a fishmouth orientation  similar to the right.  Superior and  inferior skin flaps were raised up to the clavicle and down to the  inferior mammary fold.  There was significant scarring along the  inframammary fold again from where the implant was placed.  Once we got  to axilla, prior to this and prior to incision, we injected 4 mL of  methylene blue dye in a subcuticular fashion around the nipple areolar  complex and massaged.  NeoProbe was used and we identified 4 hot nodes  in her left axilla, 3 of the 4 of these were blue.  Touch prep revealed  these to be negative.  Backgrounds approach that of zero in the axilla  after removal of these nodes.  After the skin flaps were raised, nodes  were removed.  The breast was removed in a medial to lateral fashion.  In similar fashion, we removed the left implant, which was intact with  no signs of rupture.  The capsules were also excised on the left side.  We irrigated, inspected for hemostasis, and found to be excellent.  We  placed 19-Blake  drains into both left and right mastectomy wounds and  then closed the wounds in layers with a deep layer of 3-0 Vicryl and  subsequent 4-0 Monocryl subcuticular stitch.  Dermabond was applied.  All final counts of sponge, needle, and instruments were found to be  correct at this portion of the case.  The patient was then awoke.  At  this point in time, taken to the recovery room in satisfactory  condition.      Thomas A. Cornett, M.D.  Electronically Signed     TAC/MEDQ  D:  02/04/2009  T:  02/05/2009  Job:  [id number]   cc:   Valentino Hue. Magrinat, M.D.  Dario Guardian, M.D.

## 2011-02-10 NOTE — Op Note (Signed)
Elizabeth Dudley, Elizabeth Dudley               ACCOUNT NO.:  [id number]   MEDICAL RECORD NO.:  [id number]          PATIENT TYPE:  AMB   LOCATION:  DSC                          FACILITY:  [facility]   PHYSICIAN:  Thomas A. Cornett, M.D.DATE OF BIRTH:  [birth year]   DATE OF PROCEDURE:  03/11/2009  DATE OF DISCHARGE:  03/11/2009                               OPERATIVE REPORT   PREOPERATIVE DIAGNOSIS:  Poor venous access with stage II left breast  cancer.   POSTOPERATIVE DIAGNOSIS:  Poor venous access with stage II left breast  cancer.   PROCEDURE:  Placement of right subclavian 8-French Power Port-A-Cath  with fluoroscopy.   SURGEON:  Maisie Fus A. Cornett, MD   ANESTHESIA:  LMA with 0.25% Sensorcaine local.   ESTIMATED BLOOD LOSS:  20 mL.   SPECIMENS:  None.   INDICATIONS FOR PROCEDURE:  The patient is a 50 year old female who was  recently diagnosed with breast cancer.  She is in need of a Port-A-Cath  for postoperative chemotherapy.  She presents today for that.   DESCRIPTION OF PROCEDURE:  The patient was brought to the operating room  and placed supine.  After induction of general anesthesia, the upper  chest region and neck regions were prepped and draped in a sterile  fashion.  She was placed in Trendelenburg.  The right subclavian vein  was cannulated easily and the wire was fed through this and fluoroscopy  showed the wire going down into the superior vena cava.  I removed the  needle, left the wire in place.  A small incision was made at the wire  entrance site.  Below this, a 3-cm incision was made and dissection was  carried down to her chest wall.  Small pocket was made bluntly with my  finger.  An 8-French Power Port was brought onto the field and I  attached the catheter to the port itself.  This was flushed.  Catheter  was then tunneled from the lower incision to the upper incision where  the wire exited.  We then put the port into the pocket we created.  I  cut the catheter to  about 17 cm.  With the patient in Trendelenburg, I  passed the dilator introducer complex over the wire moving the wire to-  and-fro without resistance.  Once the introducer was in place, I removed  the dilator and wire.  We put the catheter and peel-away sheath and  peeled away the sheath without difficulty.  Fluoroscopy showed the tip  to be in the mid to upper superior vena cava.  No evidence of any  pneumothorax that I could see.  The catheter then drew back dark blood  without difficulty and flushed quite easily.  It was secured to the  chest wall with 2-0 Prolene.  3-0 Vicryl was used to close the deep  layer of tissue and 4-0 Monocryl was used to  close skin incisions.  Dermabond was applied.  All final counts of  sponge, needle, and instruments were found to be correct at this portion  of the case.  The patient was then awoke, taken  to the recovery room in  satisfactory condition where chest x-ray will be obtained.      Thomas A. Cornett, M.D.  Electronically Signed     TAC/MEDQ  D:  03/11/2009  T:  03/12/2009  Job:  [id number]   cc:   Valentino Hue. Magrinat, M.D.

## 2011-02-11 ENCOUNTER — Other Ambulatory Visit: Payer: PRIVATE HEALTH INSURANCE

## 2011-02-12 ENCOUNTER — Other Ambulatory Visit: Payer: PRIVATE HEALTH INSURANCE

## 2011-02-13 ENCOUNTER — Other Ambulatory Visit: Payer: Self-pay | Admitting: Oncology

## 2011-02-13 ENCOUNTER — Encounter (HOSPITAL_BASED_OUTPATIENT_CLINIC_OR_DEPARTMENT_OTHER): Payer: PRIVATE HEALTH INSURANCE | Admitting: Oncology

## 2011-02-13 DIAGNOSIS — C50419 Malignant neoplasm of upper-outer quadrant of unspecified female breast: Secondary | ICD-10-CM

## 2011-02-13 DIAGNOSIS — D6859 Other primary thrombophilia: Secondary | ICD-10-CM

## 2011-02-13 LAB — COMPREHENSIVE METABOLIC PANEL
Albumin: 4.4 g/dL (ref 3.5–5.2)
CO2: 21 mEq/L (ref 19–32)
Calcium: 9.4 mg/dL (ref 8.4–10.5)
Glucose, Bld: 117 mg/dL — ABNORMAL HIGH (ref 70–99)
Potassium: 4.4 mEq/L (ref 3.5–5.3)
Sodium: 140 mEq/L (ref 135–145)
Total Protein: 6.8 g/dL (ref 6.0–8.3)

## 2011-02-13 LAB — CANCER ANTIGEN 27.29: CA 27.29: 20 U/mL (ref 0–39)

## 2011-02-13 LAB — CBC WITH DIFFERENTIAL/PLATELET
Eosinophils Absolute: 0 10*3/uL (ref 0.0–0.5)
LYMPH%: 28.5 % (ref 14.0–49.7)
MONO#: 0.4 10*3/uL (ref 0.1–0.9)
NEUT#: 3.1 10*3/uL (ref 1.5–6.5)
Platelets: 215 10*3/uL (ref 145–400)
RBC: 4.19 10*6/uL (ref 3.70–5.45)
RDW: 13.1 % (ref 11.2–14.5)
WBC: 5 10*3/uL (ref 3.9–10.3)

## 2011-02-13 NOTE — Procedures (Signed)
NAME:  Elizabeth Dudley, Elizabeth Dudley               ACCOUNT NO.:  [id number]   MEDICAL RECORD NO.:  [id number]          PATIENT TYPE:  OUT   LOCATION:  SLEEP CENTER                 FACILITY:  [facility]   PHYSICIAN:  Clinton D. Maple Hudson, M.D. DATE OF BIRTH:  06-17-1961   DATE OF ADMISSION:  05/08/2004  DATE OF DISCHARGE:  05/08/2004                              NOCTURNAL POLYSOMNOGRAM   REFERRING PHYSICIAN:  Dr. Merri Brunette   INDICATION FOR STUDY:  Hypersomnia with sleep apnea.  Epworth Sleepiness  score 18/24.  BMI 22.  Weight 130 pounds.   MEDICATIONS:  Albuterol used p.r.n.   SLEEP ARCHITECTURE:  Total sleep time 369 minutes, with sleep efficiency  82%.  Stage 1 was 8%, stage 2 of 44%, stages 3 and 4 of 5%, REM was 14% of  total sleep time.  Latency to sleep onset 67 minutes.  Latency to REM 168  minutes, awake after sleep onset 81 minutes, arousal index 12.7 per hour.   RESPIRATORY DATA:  Rare hypopneas were recorded for an RDI of 0.8 per hour  which is within normal limits.  She slept mostly supine.  REM RDI was 4.1  per hour.   OXYGEN DATA:  Mild periodic snoring with normal oxygenation.  Oxygen  saturation nadir was 92% during the study.   CARDIAC DATA:  Normal rhythm.   MOVEMENT/PARASOMNIA:  Recorded were 13 limb jerks of which 8 were associated  with arousal or awakening for periodic limb movement with arousal index of  1.3 per hour indicating very mild periodic limb movement syndrome.   IMPRESSION/RECOMMENDATION:  No significant sleep disordered breathing.  Minimal snoring while sleeping supine.  REM RDI indicates somewhat more  events in REM, but overall pattern is not considered medically significant.  Very mild periodic limb movements with arousal noted incidentally.                                   ______________________________                                Rennis Chris Maple Hudson, M.D.                                Diplomate, Biomedical engineer of Sleep Medicine    CDY/MEDQ  D:   05/11/2004 14:28:29  T:  05/12/2004 11:48:48  Job:  [id number]

## 2011-02-16 ENCOUNTER — Encounter (HOSPITAL_BASED_OUTPATIENT_CLINIC_OR_DEPARTMENT_OTHER): Payer: PRIVATE HEALTH INSURANCE | Admitting: Oncology

## 2011-02-16 DIAGNOSIS — D6859 Other primary thrombophilia: Secondary | ICD-10-CM

## 2011-02-16 DIAGNOSIS — C50419 Malignant neoplasm of upper-outer quadrant of unspecified female breast: Secondary | ICD-10-CM

## 2011-02-24 ENCOUNTER — Other Ambulatory Visit: Payer: PRIVATE HEALTH INSURANCE

## 2011-02-25 ENCOUNTER — Ambulatory Visit
Admission: RE | Admit: 2011-02-25 | Discharge: 2011-02-25 | Disposition: A | Discharge status: Home / Self Care | Payer: PRIVATE HEALTH INSURANCE | Source: Ambulatory Visit | Attending: Oncology | Admitting: Oncology

## 2011-02-25 MED ORDER — IOHEXOL 300 MG/ML  SOLN
75.0000 mL | Freq: Once | INTRAMUSCULAR | Status: AC | PRN
  Administered 2011-02-25: 75 mL via INTRAVENOUS

## 2011-02-26 ENCOUNTER — Other Ambulatory Visit: Payer: PRIVATE HEALTH INSURANCE

## 2011-04-28 ENCOUNTER — Other Ambulatory Visit: Payer: Self-pay | Admitting: Family Medicine

## 2011-04-28 DIAGNOSIS — M545 Low back pain, unspecified: Secondary | ICD-10-CM

## 2011-05-08 ENCOUNTER — Ambulatory Visit
Admission: RE | Admit: 2011-05-08 | Discharge: 2011-05-08 | Disposition: A | Discharge status: Home / Self Care | Payer: PRIVATE HEALTH INSURANCE | Source: Ambulatory Visit | Attending: Family Medicine | Admitting: Family Medicine

## 2011-05-08 DIAGNOSIS — M545 Low back pain, unspecified: Secondary | ICD-10-CM

## 2011-05-08 MED ORDER — GADOBENATE DIMEGLUMINE 529 MG/ML IV SOLN
11.0000 mL | Freq: Once | INTRAVENOUS | Status: AC | PRN
  Administered 2011-05-08: 11 mL via INTRAVENOUS

## 2011-05-27 ENCOUNTER — Ambulatory Visit
Admission: RE | Admit: 2011-05-27 | Discharge: 2011-05-27 | Disposition: A | Discharge status: Home / Self Care | Payer: PRIVATE HEALTH INSURANCE | Source: Ambulatory Visit | Attending: Family Medicine | Admitting: Family Medicine

## 2011-05-27 ENCOUNTER — Other Ambulatory Visit: Payer: Self-pay | Admitting: Family Medicine

## 2011-05-27 DIAGNOSIS — M25551 Pain in right hip: Secondary | ICD-10-CM

## 2011-05-27 DIAGNOSIS — R102 Pelvic and perineal pain: Secondary | ICD-10-CM

## 2011-05-27 DIAGNOSIS — M25552 Pain in left hip: Secondary | ICD-10-CM

## 2011-06-10 ENCOUNTER — Other Ambulatory Visit: Payer: Self-pay | Admitting: Neurosurgery

## 2011-06-10 DIAGNOSIS — M5412 Radiculopathy, cervical region: Secondary | ICD-10-CM

## 2011-06-13 ENCOUNTER — Ambulatory Visit
Admission: RE | Admit: 2011-06-13 | Discharge: 2011-06-13 | Disposition: A | Discharge status: Home / Self Care | Payer: PRIVATE HEALTH INSURANCE | Source: Ambulatory Visit | Attending: Neurosurgery | Admitting: Neurosurgery

## 2011-06-13 DIAGNOSIS — M5412 Radiculopathy, cervical region: Secondary | ICD-10-CM

## 2011-08-12 ENCOUNTER — Other Ambulatory Visit: Payer: Self-pay | Admitting: Oncology

## 2011-08-12 ENCOUNTER — Other Ambulatory Visit (HOSPITAL_BASED_OUTPATIENT_CLINIC_OR_DEPARTMENT_OTHER): Payer: PRIVATE HEALTH INSURANCE

## 2011-08-12 DIAGNOSIS — C50419 Malignant neoplasm of upper-outer quadrant of unspecified female breast: Secondary | ICD-10-CM

## 2011-08-12 DIAGNOSIS — D6859 Other primary thrombophilia: Secondary | ICD-10-CM

## 2011-08-12 LAB — CBC WITH DIFFERENTIAL/PLATELET
BASO%: 0.5 % (ref 0.0–2.0)
EOS%: 1.2 % (ref 0.0–7.0)
HCT: 42.9 % (ref 34.8–46.6)
LYMPH%: 34.6 % (ref 14.0–49.7)
MCH: 32.5 pg (ref 25.1–34.0)
MCHC: 34.7 g/dL (ref 31.5–36.0)
MCV: 93.7 fL (ref 79.5–101.0)
MONO%: 7 % (ref 0.0–14.0)
NEUT%: 56.7 % (ref 38.4–76.8)
Platelets: 235 10*3/uL (ref 145–400)

## 2011-08-13 LAB — COMPREHENSIVE METABOLIC PANEL
ALT: 46 U/L — ABNORMAL HIGH (ref 0–35)
AST: 39 U/L — ABNORMAL HIGH (ref 0–37)
CO2: 24 mEq/L (ref 19–32)
Creatinine, Ser: 0.77 mg/dL (ref 0.50–1.10)
Total Bilirubin: 0.4 mg/dL (ref 0.3–1.2)

## 2011-08-13 LAB — CANCER ANTIGEN 27.29: CA 27.29: 16 U/mL (ref 0–39)

## 2011-08-18 ENCOUNTER — Encounter: Payer: Self-pay | Admitting: *Deleted

## 2011-08-19 ENCOUNTER — Telehealth: Payer: Self-pay | Admitting: Oncology

## 2011-08-19 ENCOUNTER — Ambulatory Visit (HOSPITAL_BASED_OUTPATIENT_CLINIC_OR_DEPARTMENT_OTHER): Payer: PRIVATE HEALTH INSURANCE | Admitting: Oncology

## 2011-08-19 VITALS — BP 120/70 | HR 70 | Temp 98.3°F | Ht 64.0 in | Wt 132.2 lb

## 2011-08-19 DIAGNOSIS — M542 Cervicalgia: Secondary | ICD-10-CM

## 2011-08-19 DIAGNOSIS — G4700 Insomnia, unspecified: Secondary | ICD-10-CM

## 2011-08-19 DIAGNOSIS — Q359 Cleft palate, unspecified: Secondary | ICD-10-CM | POA: Insufficient documentation

## 2011-08-19 DIAGNOSIS — D6859 Other primary thrombophilia: Secondary | ICD-10-CM

## 2011-08-19 DIAGNOSIS — J45909 Unspecified asthma, uncomplicated: Secondary | ICD-10-CM | POA: Insufficient documentation

## 2011-08-19 DIAGNOSIS — D689 Coagulation defect, unspecified: Secondary | ICD-10-CM | POA: Insufficient documentation

## 2011-08-19 DIAGNOSIS — C50919 Malignant neoplasm of unspecified site of unspecified female breast: Secondary | ICD-10-CM

## 2011-08-19 MED ORDER — GABAPENTIN 300 MG PO CAPS: 300.0000 mg | ORAL_CAPSULE | Freq: Every day | ORAL | Status: DC
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
## 2011-08-19 NOTE — Telephone Encounter (Signed)
Gv pt appt for may2013 

## 2011-10-30 ENCOUNTER — Other Ambulatory Visit: Payer: Self-pay | Admitting: *Deleted

## 2011-10-30 DIAGNOSIS — B370 Candidal stomatitis: Secondary | ICD-10-CM

## 2011-10-30 MED ORDER — FLUCONAZOLE 100 MG PO TABS: 100.0000 mg | ORAL_TABLET | Freq: Every day | ORAL | Status: AC

## 2011-10-30 NOTE — Telephone Encounter (Signed)
Pt. Called stating that Dr. Darnelle Catalan had given her something for yeast from her asthma inhalers before & wanted to know if that could be called in to [facility], [place].  Script sent to pharmacy.

## 2011-12-31 ENCOUNTER — Other Ambulatory Visit: Payer: Self-pay | Admitting: *Deleted

## 2011-12-31 DIAGNOSIS — Z853 Personal history of malignant neoplasm of breast: Secondary | ICD-10-CM | POA: Insufficient documentation

## 2011-12-31 NOTE — Progress Notes (Signed)
Pt called to this RN to state onset noted of " throbbing " under left arm into bicep area.  Above is concerning due to above symptoms were occurring for 2 years prior to breast cancer dx.  Per discussion Kyrstyn states history of above symptoms seemed to be associated with menstrual cycle. Pt had not had a period since 1st chemotherapy respectfully September of 2010.  Margareth is currently scheduled for MD follow up in May and is inquiring if she should be seen sooner.  Plan per call is to schedule cxr and U/S per above. Additional labs for ovarian function and appointment with MD to be rescheduled to April.  Above orders generated and POF sent to scheduling.

## 2012-01-01 ENCOUNTER — Telehealth: Payer: Self-pay | Admitting: *Deleted

## 2012-01-01 NOTE — Telephone Encounter (Signed)
LEFT VOICE MESSAGE TO INFORM THE PATIENT OF THE NEW DATE AND TIME OF THEULTRASOUND OF THE LEFT BREAST

## 2012-01-05 ENCOUNTER — Other Ambulatory Visit: Payer: PRIVATE HEALTH INSURANCE

## 2012-01-07 ENCOUNTER — Ambulatory Visit
Admission: RE | Admit: 2012-01-07 | Discharge: 2012-01-07 | Disposition: A | Discharge status: Home / Self Care | Payer: PRIVATE HEALTH INSURANCE | Source: Ambulatory Visit | Attending: Oncology | Admitting: Oncology

## 2012-01-07 DIAGNOSIS — Z853 Personal history of malignant neoplasm of breast: Secondary | ICD-10-CM

## 2012-01-11 ENCOUNTER — Ambulatory Visit (HOSPITAL_BASED_OUTPATIENT_CLINIC_OR_DEPARTMENT_OTHER): Payer: PRIVATE HEALTH INSURANCE | Admitting: Oncology

## 2012-01-11 VITALS — BP 106/64 | HR 69 | Temp 98.0°F | Ht 64.0 in | Wt 133.9 lb

## 2012-01-11 DIAGNOSIS — C50919 Malignant neoplasm of unspecified site of unspecified female breast: Secondary | ICD-10-CM

## 2012-01-11 DIAGNOSIS — Z853 Personal history of malignant neoplasm of breast: Secondary | ICD-10-CM
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
## 2012-01-12 ENCOUNTER — Telehealth: Payer: Self-pay | Admitting: *Deleted

## 2012-01-12 NOTE — Telephone Encounter (Signed)
left voice message informing the patient of the new date and time on 07-13-2012 starting at 11:15am
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
## 2012-02-09 ENCOUNTER — Other Ambulatory Visit: Payer: PRIVATE HEALTH INSURANCE | Admitting: Lab

## 2012-02-16 ENCOUNTER — Ambulatory Visit: Payer: PRIVATE HEALTH INSURANCE | Admitting: Physician Assistant

## 2012-04-06 ENCOUNTER — Other Ambulatory Visit: Payer: Self-pay | Admitting: *Deleted

## 2012-04-06 MED ORDER — FLUCONAZOLE 100 MG PO TABS: 100.0000 mg | ORAL_TABLET | Freq: Every day | ORAL | Status: AC

## 2012-07-04 ENCOUNTER — Other Ambulatory Visit: Payer: Self-pay | Admitting: Oncology

## 2012-07-13 ENCOUNTER — Other Ambulatory Visit (HOSPITAL_BASED_OUTPATIENT_CLINIC_OR_DEPARTMENT_OTHER): Payer: PRIVATE HEALTH INSURANCE | Admitting: Lab

## 2012-07-13 ENCOUNTER — Telehealth: Payer: Self-pay | Admitting: *Deleted

## 2012-07-13 ENCOUNTER — Encounter: Payer: Self-pay | Admitting: Physician Assistant

## 2012-07-13 ENCOUNTER — Ambulatory Visit (HOSPITAL_BASED_OUTPATIENT_CLINIC_OR_DEPARTMENT_OTHER): Payer: PRIVATE HEALTH INSURANCE | Admitting: Physician Assistant

## 2012-07-13 VITALS — BP 100/65 | HR 71 | Temp 98.3°F | Resp 20 | Ht 64.0 in | Wt 128.2 lb

## 2012-07-13 DIAGNOSIS — C50419 Malignant neoplasm of upper-outer quadrant of unspecified female breast: Secondary | ICD-10-CM

## 2012-07-13 DIAGNOSIS — Z853 Personal history of malignant neoplasm of breast: Secondary | ICD-10-CM

## 2012-07-13 DIAGNOSIS — C50919 Malignant neoplasm of unspecified site of unspecified female breast: Secondary | ICD-10-CM

## 2012-07-13 DIAGNOSIS — Z171 Estrogen receptor negative status [ER-]: Secondary | ICD-10-CM

## 2012-07-13 LAB — CBC WITH DIFFERENTIAL/PLATELET
Basophils Absolute: 0 10*3/uL (ref 0.0–0.1)
Eosinophils Absolute: 0.1 10*3/uL (ref 0.0–0.5)
HGB: 15.8 g/dL (ref 11.6–15.9)
MCV: 97.8 fL (ref 79.5–101.0)
MONO%: 8 % (ref 0.0–14.0)
NEUT#: 2.5 10*3/uL (ref 1.5–6.5)
RDW: 12.9 % (ref 11.2–14.5)

## 2012-07-13 LAB — COMPREHENSIVE METABOLIC PANEL (CC13)
AST: 39 U/L — ABNORMAL HIGH (ref 5–34)
Albumin: 4.2 g/dL (ref 3.5–5.0)
BUN: 15 mg/dL (ref 7.0–26.0)
Calcium: 10.1 mg/dL (ref 8.4–10.4)
Chloride: 107 mEq/L (ref 98–107)
Glucose: 92 mg/dl (ref 70–99)
Potassium: 4.1 mEq/L (ref 3.5–5.1)

## 2012-07-13 NOTE — Telephone Encounter (Signed)
Gave patient instructions on getting her walkin chest x-ray before her md appointment  Gave patient appointment for lab only week before the lab appointment
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
## 2012-07-14 LAB — CANCER ANTIGEN 27.29: CA 27.29: 19 U/mL (ref 0–39)

## 2013-01-10 ENCOUNTER — Other Ambulatory Visit (HOSPITAL_BASED_OUTPATIENT_CLINIC_OR_DEPARTMENT_OTHER): Payer: 59 | Admitting: Lab

## 2013-01-10 DIAGNOSIS — Z853 Personal history of malignant neoplasm of breast: Secondary | ICD-10-CM

## 2013-01-10 DIAGNOSIS — C50419 Malignant neoplasm of upper-outer quadrant of unspecified female breast: Secondary | ICD-10-CM

## 2013-01-10 LAB — CBC WITH DIFFERENTIAL/PLATELET
BASO%: 0.4 % (ref 0.0–2.0)
Basophils Absolute: 0 10*3/uL (ref 0.0–0.1)
EOS%: 1.1 % (ref 0.0–7.0)
HGB: 14.3 g/dL (ref 11.6–15.9)
MCH: 32.1 pg (ref 25.1–34.0)
RDW: 13.3 % (ref 11.2–14.5)
lymph#: 2 10*3/uL (ref 0.9–3.3)

## 2013-01-10 LAB — COMPREHENSIVE METABOLIC PANEL (CC13)
ALT: 54 U/L (ref 0–55)
AST: 44 U/L — ABNORMAL HIGH (ref 5–34)
Albumin: 3.7 g/dL (ref 3.5–5.0)
Alkaline Phosphatase: 73 U/L (ref 40–150)
BUN: 14.7 mg/dL (ref 7.0–26.0)
Creatinine: 0.8 mg/dL (ref 0.6–1.1)
Potassium: 4.1 mEq/L (ref 3.5–5.1)

## 2013-01-17 ENCOUNTER — Ambulatory Visit (HOSPITAL_BASED_OUTPATIENT_CLINIC_OR_DEPARTMENT_OTHER): Payer: 59 | Admitting: Oncology

## 2013-01-17 ENCOUNTER — Telehealth: Payer: Self-pay | Admitting: *Deleted

## 2013-01-17 VITALS — BP 135/77 | HR 66 | Temp 98.0°F | Resp 20 | Ht 64.0 in | Wt 126.7 lb

## 2013-01-17 DIAGNOSIS — Z853 Personal history of malignant neoplasm of breast: Secondary | ICD-10-CM

## 2013-01-17 DIAGNOSIS — Z171 Estrogen receptor negative status [ER-]: Secondary | ICD-10-CM

## 2013-01-17 DIAGNOSIS — Z9010 Acquired absence of unspecified breast and nipple: Secondary | ICD-10-CM

## 2013-01-17 DIAGNOSIS — C50419 Malignant neoplasm of upper-outer quadrant of unspecified female breast: Secondary | ICD-10-CM

## 2013-01-17 DIAGNOSIS — D689 Coagulation defect, unspecified: Secondary | ICD-10-CM

## 2013-01-17 DIAGNOSIS — D6859 Other primary thrombophilia: Secondary | ICD-10-CM
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
## 2013-01-17 NOTE — Telephone Encounter (Signed)
appts made and printed...td 

## 2013-12-18 ENCOUNTER — Other Ambulatory Visit: Payer: Self-pay | Admitting: Family Medicine

## 2013-12-18 ENCOUNTER — Other Ambulatory Visit (HOSPITAL_COMMUNITY)
Admission: RE | Admit: 2013-12-18 | Discharge: 2013-12-18 | Disposition: A | Discharge status: Home / Self Care | Payer: 59 | Source: Ambulatory Visit | Attending: Family Medicine | Admitting: Family Medicine

## 2013-12-18 DIAGNOSIS — Z124 Encounter for screening for malignant neoplasm of cervix: Secondary | ICD-10-CM | POA: Insufficient documentation

## 2014-01-10 ENCOUNTER — Other Ambulatory Visit: Payer: 59

## 2014-01-17 ENCOUNTER — Ambulatory Visit (HOSPITAL_BASED_OUTPATIENT_CLINIC_OR_DEPARTMENT_OTHER): Payer: 59

## 2014-01-17 ENCOUNTER — Ambulatory Visit (HOSPITAL_BASED_OUTPATIENT_CLINIC_OR_DEPARTMENT_OTHER): Payer: 59 | Admitting: Physician Assistant

## 2014-01-17 ENCOUNTER — Telehealth: Payer: Self-pay | Admitting: Oncology

## 2014-01-17 ENCOUNTER — Encounter: Payer: Self-pay | Admitting: Physician Assistant

## 2014-01-17 VITALS — BP 108/66 | HR 62 | Temp 98.6°F | Resp 18 | Ht 64.0 in | Wt 128.6 lb

## 2014-01-17 DIAGNOSIS — Z853 Personal history of malignant neoplasm of breast: Secondary | ICD-10-CM

## 2014-01-17 DIAGNOSIS — D6859 Other primary thrombophilia: Secondary | ICD-10-CM

## 2014-01-17 DIAGNOSIS — Z803 Family history of malignant neoplasm of breast: Secondary | ICD-10-CM

## 2014-01-17 DIAGNOSIS — N912 Amenorrhea, unspecified: Secondary | ICD-10-CM

## 2014-01-17 LAB — CBC WITH DIFFERENTIAL/PLATELET
BASO%: 0.4 % (ref 0.0–2.0)
BASOS ABS: 0 10*3/uL (ref 0.0–0.1)
EOS ABS: 0.1 10*3/uL (ref 0.0–0.5)
EOS%: 1 % (ref 0.0–7.0)
HCT: 44.6 % (ref 34.8–46.6)
HEMOGLOBIN: 14.8 g/dL (ref 11.6–15.9)
LYMPH#: 2.2 10*3/uL (ref 0.9–3.3)
LYMPH%: 31.5 % (ref 14.0–49.7)
MCH: 32.6 pg (ref 25.1–34.0)
MCHC: 33.3 g/dL (ref 31.5–36.0)
MCV: 97.7 fL (ref 79.5–101.0)
MONO#: 0.5 10*3/uL (ref 0.1–0.9)
MONO%: 6.6 % (ref 0.0–14.0)
NEUT%: 60.5 % (ref 38.4–76.8)
NEUTROS ABS: 4.3 10*3/uL (ref 1.5–6.5)
Platelets: 230 10*3/uL (ref 145–400)
RBC: 4.56 10*6/uL (ref 3.70–5.45)
RDW: 13.5 % (ref 11.2–14.5)
WBC: 7 10*3/uL (ref 3.9–10.3)

## 2014-01-17 LAB — COMPREHENSIVE METABOLIC PANEL (CC13)
ALK PHOS: 92 U/L (ref 40–150)
ALT: 51 U/L (ref 0–55)
AST: 42 U/L — AB (ref 5–34)
Albumin: 4.2 g/dL (ref 3.5–5.0)
Anion Gap: 9 mEq/L (ref 3–11)
BUN: 17.5 mg/dL (ref 7.0–26.0)
CO2: 23 mEq/L (ref 22–29)
Calcium: 10 mg/dL (ref 8.4–10.4)
Chloride: 108 mEq/L (ref 98–109)
Creatinine: 0.8 mg/dL (ref 0.6–1.1)
GLUCOSE: 79 mg/dL (ref 70–140)
POTASSIUM: 4.1 meq/L (ref 3.5–5.1)
SODIUM: 140 meq/L (ref 136–145)
TOTAL PROTEIN: 7.8 g/dL (ref 6.4–8.3)
Total Bilirubin: 0.45 mg/dL (ref 0.20–1.20)

## 2014-01-17 NOTE — Telephone Encounter (Signed)
per pof to sch appt/pof to send to lab today-sent pt to labs-printed & gave pt copy of sch
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
## 2014-01-18 LAB — FOLLICLE STIMULATING HORMONE: FSH: 95.7 m[IU]/mL

## 2014-01-18 LAB — LUTEINIZING HORMONE: LH: 42.2 m[IU]/mL

## 2014-01-22 LAB — ESTRADIOL, ULTRA SENS: Estradiol, Ultra Sensitive: <2 pg/mL

## 2014-01-29 ENCOUNTER — Telehealth: Payer: Self-pay

## 2014-01-29 NOTE — Telephone Encounter (Signed)
Pt requests we call her in June to let her know when class will be held.

## 2014-05-07 ENCOUNTER — Telehealth: Payer: Self-pay

## 2014-05-07 NOTE — Telephone Encounter (Signed)
Rcvd by fax office note from [place] 05/07/14 Dr. Collene Mares.  Copy to GM.  Original to scan.

## 2014-10-22 ENCOUNTER — Telehealth: Payer: Self-pay | Admitting: Oncology

## 2014-10-22 NOTE — Telephone Encounter (Signed)
due to bmdc moved 4/20 appt to PM. s/w pt she is aware. also confirmed 4/13 appt.

## 2015-01-09 ENCOUNTER — Other Ambulatory Visit (HOSPITAL_BASED_OUTPATIENT_CLINIC_OR_DEPARTMENT_OTHER): Payer: Managed Care, Other (non HMO)

## 2015-01-09 ENCOUNTER — Other Ambulatory Visit: Payer: Self-pay | Admitting: *Deleted

## 2015-01-09 DIAGNOSIS — Z853 Personal history of malignant neoplasm of breast: Secondary | ICD-10-CM | POA: Diagnosis not present

## 2015-01-09 LAB — COMPREHENSIVE METABOLIC PANEL (CC13)
ALT: 42 U/L (ref 0–55)
ANION GAP: 11 meq/L (ref 3–11)
AST: 34 U/L (ref 5–34)
Albumin: 4.1 g/dL (ref 3.5–5.0)
Alkaline Phosphatase: 85 U/L (ref 40–150)
BILIRUBIN TOTAL: 0.56 mg/dL (ref 0.20–1.20)
BUN: 13.6 mg/dL (ref 7.0–26.0)
CO2: 26 mEq/L (ref 22–29)
CREATININE: 0.7 mg/dL (ref 0.6–1.1)
Calcium: 9.5 mg/dL (ref 8.4–10.4)
Chloride: 105 mEq/L (ref 98–109)
EGFR: >90 mL/min/{1.73_m2} (ref >90)
Glucose: 81 mg/dl (ref 70–140)
Potassium: 4 mEq/L (ref 3.5–5.1)
Sodium: 143 mEq/L (ref 136–145)
Total Protein: 7.4 g/dL (ref 6.4–8.3)

## 2015-01-09 LAB — CBC WITH DIFFERENTIAL/PLATELET
BASO%: 1.2 % (ref 0.0–2.0)
Basophils Absolute: 0.1 10*3/uL (ref 0.0–0.1)
EOS ABS: 0.1 10*3/uL (ref 0.0–0.5)
EOS%: 1.3 % (ref 0.0–7.0)
HEMATOCRIT: 45.4 % (ref 34.8–46.6)
HGB: 15.1 g/dL (ref 11.6–15.9)
LYMPH%: 28.8 % (ref 14.0–49.7)
MCH: 31.8 pg (ref 25.1–34.0)
MCHC: 33.3 g/dL (ref 31.5–36.0)
MCV: 95.5 fL (ref 79.5–101.0)
MONO#: 0.4 10*3/uL (ref 0.1–0.9)
MONO%: 6.8 % (ref 0.0–14.0)
NEUT#: 3.8 10*3/uL (ref 1.5–6.5)
NEUT%: 61.9 % (ref 38.4–76.8)
PLATELETS: 242 10*3/uL (ref 145–400)
RBC: 4.75 10*6/uL (ref 3.70–5.45)
RDW: 13.5 % (ref 11.2–14.5)
WBC: 6.2 10*3/uL (ref 3.9–10.3)
lymph#: 1.8 10*3/uL (ref 0.9–3.3)

## 2015-01-16 ENCOUNTER — Ambulatory Visit (HOSPITAL_BASED_OUTPATIENT_CLINIC_OR_DEPARTMENT_OTHER): Payer: Managed Care, Other (non HMO) | Admitting: Oncology

## 2015-01-16 VITALS — BP 113/80 | HR 72 | Temp 98.3°F | Resp 18 | Ht 64.0 in | Wt 130.1 lb

## 2015-01-16 DIAGNOSIS — C50912 Malignant neoplasm of unspecified site of left female breast: Secondary | ICD-10-CM | POA: Insufficient documentation

## 2015-01-16 DIAGNOSIS — D6851 Activated protein C resistance: Secondary | ICD-10-CM

## 2015-01-16 DIAGNOSIS — Z853 Personal history of malignant neoplasm of breast: Secondary | ICD-10-CM | POA: Diagnosis not present

## 2015-01-16 DIAGNOSIS — D689 Coagulation defect, unspecified: Secondary | ICD-10-CM

## 2015-01-16 DIAGNOSIS — Z803 Family history of malignant neoplasm of breast: Secondary | ICD-10-CM
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
## 2015-04-09 ENCOUNTER — Encounter: Payer: Self-pay | Admitting: Genetic Counselor

## 2015-07-29 ENCOUNTER — Ambulatory Visit
Admission: RE | Admit: 2015-07-29 | Discharge: 2015-07-29 | Disposition: A | Discharge status: Home / Self Care | Payer: Managed Care, Other (non HMO) | Source: Ambulatory Visit | Attending: Family Medicine | Admitting: Family Medicine

## 2015-07-29 ENCOUNTER — Other Ambulatory Visit: Payer: Self-pay | Admitting: Family Medicine

## 2015-07-29 DIAGNOSIS — R0602 Shortness of breath: Secondary | ICD-10-CM

## 2015-08-02 ENCOUNTER — Telehealth: Payer: Self-pay | Admitting: *Deleted

## 2015-08-02 NOTE — Telephone Encounter (Signed)
On 08-01-15 the pt came by pick up medical records it was consult note, end of tx note, sim & planning note, follow up note.

## 2015-09-02 ENCOUNTER — Telehealth: Payer: Self-pay | Admitting: Oncology

## 2015-09-02 ENCOUNTER — Telehealth: Payer: Self-pay | Admitting: *Deleted

## 2015-09-02 NOTE — Telephone Encounter (Signed)
Returned patients call and asked that when she calls back to ask for triage

## 2015-09-02 NOTE — Telephone Encounter (Signed)
"  I am a patient of Dr. Jana Hakim he will see as needed.  I am experiencing fatigue.  Is this feeling tired related to chemotherapy?  He said what I received had aged me ten years.  If this is correct, I'm functioning at a 54 yr old level.  I need a letter for work and my husband needs to read it.  He says I'm a big baby.  I am tired all the time.  A sleep study has been done was negative even though my husband says I stop breathing.  CXR was normal.   I saw my PCP at [facility] 1 to 2 weeks ago but haven't received lab results yet.  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  My job requires to be able to work with or lift up to 200 lbs.  I drive fifty minutes each way to work and am falling asleep often.  Patient's notice me sleeping when I work with them.   I work full time, no breaks, I can't do this the rest of my life.  My husband thinks I should come home, cook and do all the things I used to do but I can't.  I think I need to cut back my hours."  Per Mosaiq she received adjuvant A/C received 03-14-2009 through 04-26-2009, followed by Taxol in August 201o through 07-10-2009 and radiation.  Unable to tell me what labs were drawn at [facility].  Advised her we can't see [facility] information.  She will contact [facility] for lab results as this nurse does not relate current fatigue with 2010 treatment. Will notify Dr Jana Hakim.  Return number [phone].           "Letter can be to whom it may concern."

## 2015-09-03 ENCOUNTER — Other Ambulatory Visit: Payer: Self-pay | Admitting: Oncology

## 2015-09-03 ENCOUNTER — Encounter: Payer: Self-pay | Admitting: Oncology

## 2015-10-03 NOTE — Telephone Encounter (Signed)
Letter created on 09-03-2015 by Dr. Jana Hakim.

## 2016-10-29 ENCOUNTER — Other Ambulatory Visit (HOSPITAL_COMMUNITY)
Admission: RE | Admit: 2016-10-29 | Discharge: 2016-10-29 | Disposition: A | Discharge status: Home / Self Care | Payer: BLUE CROSS/BLUE SHIELD | Source: Ambulatory Visit | Attending: Family Medicine | Admitting: Family Medicine

## 2016-10-29 ENCOUNTER — Other Ambulatory Visit: Payer: Self-pay | Admitting: Family Medicine

## 2016-10-29 DIAGNOSIS — Z23 Encounter for immunization: Secondary | ICD-10-CM | POA: Diagnosis not present

## 2016-10-29 DIAGNOSIS — Z0000 Encounter for general adult medical examination without abnormal findings: Secondary | ICD-10-CM | POA: Diagnosis not present

## 2016-10-29 DIAGNOSIS — Z01419 Encounter for gynecological examination (general) (routine) without abnormal findings: Secondary | ICD-10-CM | POA: Diagnosis not present

## 2016-10-29 DIAGNOSIS — Z13220 Encounter for screening for lipoid disorders: Secondary | ICD-10-CM | POA: Diagnosis not present

## 2016-11-02 LAB — CYTOLOGY - PAP: DIAGNOSIS: NEGATIVE

## 2016-12-10 DIAGNOSIS — R748 Abnormal levels of other serum enzymes: Secondary | ICD-10-CM | POA: Diagnosis not present

## 2017-01-19 DIAGNOSIS — J0110 Acute frontal sinusitis, unspecified: Secondary | ICD-10-CM | POA: Diagnosis not present

## 2017-01-19 DIAGNOSIS — J111 Influenza due to unidentified influenza virus with other respiratory manifestations: Secondary | ICD-10-CM | POA: Diagnosis not present

## 2017-01-19 DIAGNOSIS — J4530 Mild persistent asthma, uncomplicated: Secondary | ICD-10-CM | POA: Diagnosis not present

## 2018-02-10 DIAGNOSIS — E7800 Pure hypercholesterolemia, unspecified: Secondary | ICD-10-CM | POA: Diagnosis not present

## 2018-02-10 DIAGNOSIS — Z0000 Encounter for general adult medical examination without abnormal findings: Secondary | ICD-10-CM | POA: Diagnosis not present

## 2018-02-10 DIAGNOSIS — Z23 Encounter for immunization: Secondary | ICD-10-CM | POA: Diagnosis not present

## 2018-02-17 DIAGNOSIS — L821 Other seborrheic keratosis: Secondary | ICD-10-CM | POA: Diagnosis not present

## 2018-05-18 DIAGNOSIS — H43812 Vitreous degeneration, left eye: Secondary | ICD-10-CM | POA: Diagnosis not present

## 2018-11-03 DIAGNOSIS — C50911 Malignant neoplasm of unspecified site of right female breast: Secondary | ICD-10-CM | POA: Diagnosis not present

## 2018-11-15 DIAGNOSIS — C50911 Malignant neoplasm of unspecified site of right female breast: Secondary | ICD-10-CM | POA: Diagnosis not present

## 2019-04-06 DIAGNOSIS — J309 Allergic rhinitis, unspecified: Secondary | ICD-10-CM | POA: Diagnosis not present

## 2019-04-06 DIAGNOSIS — J4530 Mild persistent asthma, uncomplicated: Secondary | ICD-10-CM | POA: Diagnosis not present

## 2019-06-06 DIAGNOSIS — Z20828 Contact with and (suspected) exposure to other viral communicable diseases: Secondary | ICD-10-CM | POA: Diagnosis not present

## 2020-05-16 ENCOUNTER — Encounter: Payer: Self-pay | Admitting: Genetic Counselor

## 2020-07-16 ENCOUNTER — Other Ambulatory Visit: Payer: Self-pay | Admitting: Family Medicine

## 2020-07-16 ENCOUNTER — Other Ambulatory Visit (HOSPITAL_COMMUNITY)
Admission: RE | Admit: 2020-07-16 | Discharge: 2020-07-16 | Disposition: A | Discharge status: Home / Self Care | Payer: PRIVATE HEALTH INSURANCE | Source: Ambulatory Visit | Attending: Family Medicine | Admitting: Family Medicine

## 2020-07-16 DIAGNOSIS — Z124 Encounter for screening for malignant neoplasm of cervix: Secondary | ICD-10-CM | POA: Insufficient documentation

## 2020-07-18 LAB — CYTOLOGY - PAP
Comment: NEGATIVE
Diagnosis: NEGATIVE
High risk HPV: NEGATIVE

## 2020-08-06 ENCOUNTER — Telehealth: Payer: Self-pay | Admitting: Allergy & Immunology

## 2020-08-06 NOTE — Telephone Encounter (Signed)
Patient called to talk with some one about coming in for covid shot. [phone].

## 2020-08-07 NOTE — Telephone Encounter (Signed)
Patient called back to check on status of message. Patient had covid in July. Patient did not reaction to Miralax the one time she had it. Unsure if she has had polygloycol or polysorbate. Patient states she has reactions to the flu vaccine. Patient does not get the flu when she does not take the flu vaccine, but gets the flu severely when she does take the flu vaccine. Patient had to get 18 treatments of chemo therapy and had to take benadryl by IV an hour or two before each treatment or she would have lip, eye and throat swelling. Patient has delayed reactions so she is nervous that if she does component testing or the graded vaccine, she would reaction later in the day and not in the office. Patient also breaks out in hives when she takes some antibiotics. Patient has severe asthma and eczema. Patient was transferred to billing to talk about pricing with her [employer].  Patient states she needs to be 100% vaccinated by December 4th.  Please advise what next steps for patient should be.

## 2020-08-07 NOTE — Telephone Encounter (Signed)
Dr. Gallagher please advise.  

## 2020-08-08 NOTE — Telephone Encounter (Signed)
Called but was unable to leave a message.

## 2020-08-08 NOTE — Telephone Encounter (Signed)
I think she is right component testing is not going to answer the question were looking for here.  If she has to be 100% vaccinated by December 4, I suppose I would recommend that she get the [employer] vaccine since it is the one dose vaccine (albeit with a booster 6 months later).  I would use cetirizine 1 tablet twice a day for 2 days before the vaccine and 2 to 3 days after the vaccine.  If she wants to receive it in our office we can certainly make that happen.  But I do not think that component testing is necessary.  Salvatore Marvel, MD [facility] [place] [facility] [place]

## 2020-08-08 NOTE — Telephone Encounter (Signed)
I discussed with Marcie Bal. There is a vaccine clinic on November 18 in [place]. She could get a Pfizer vaccine there. The mRNA vaccines have better coverage against the virus anyway.  Salvatore Marvel, MD [facility] [place] [facility] [place]

## 2020-08-08 NOTE — Telephone Encounter (Signed)
Patient would like to know if you would recommend a different vaccine as she has found out that she does not have the time requirement that she did when she called yesterday. Also, due to insurance coverage reasons she can only be scheduled for a vaccine on a day and in a clinic where Dr. Maudie Mercury will be the overseeing provider. Patient is going to contact her insurance company and see about coverage details for Dr. Nelva Bush or Dr. Ernst Bowler.

## 2020-08-16 NOTE — Telephone Encounter (Signed)
I still think the Garden or Levan Hurst would by the way to go. We could switch out Zyrtec for the Allegra.   Salvatore Marvel, MD [facility] [place] [facility] [place]

## 2020-08-16 NOTE — Telephone Encounter (Signed)
Left message for patient to call our office back.

## 2020-08-16 NOTE — Telephone Encounter (Signed)
I spoke with patient again. She attempted the cetirizine regimen and only took one tablet. She says that it knocked her out and she is not sure how she if going to drive 40 miles to [place] then back home after getting the vaccine. Patient also told me that she thought that when she spoke with someone the other day she had been told to take 40 mg twice daily. I did let her know that I was the person she spoke to the other day and I did tell her 1 tablet twice daily, not 2 tablets twice daily. Patient did verbalize understanding. She would still like to know what she should do as far as the vaccine goes.

## 2020-08-21 NOTE — Telephone Encounter (Signed)
Called patient and advised. Patient verbalized understanding.  

## 2020-09-30 NOTE — Telephone Encounter (Signed)
Patient states she had a message from our office, but could not see the message. Patient wanted to inform us that she has an exemption from the vaccine for 6 months  and will call back if she needs to make an appointment.

## 2022-04-03 ENCOUNTER — Other Ambulatory Visit (HOSPITAL_COMMUNITY): Payer: Self-pay | Admitting: Orthopaedic Surgery

## 2022-04-03 ENCOUNTER — Ambulatory Visit (HOSPITAL_COMMUNITY)
Admission: RE | Admit: 2022-04-03 | Discharge: 2022-04-03 | Disposition: A | Discharge status: Home / Self Care | Payer: Worker's Compensation | Source: Ambulatory Visit | Attending: Orthopaedic Surgery | Admitting: Orthopaedic Surgery

## 2022-04-03 DIAGNOSIS — R600 Localized edema: Secondary | ICD-10-CM | POA: Insufficient documentation

## 2023-02-04 ENCOUNTER — Encounter: Payer: Self-pay | Admitting: Ophthalmology

## 2023-02-04 ENCOUNTER — Other Ambulatory Visit: Payer: Self-pay

## 2023-02-04 NOTE — Anesthesia Preprocedure Evaluation (Addendum)
Anesthesia Evaluation  Patient identified by MRN, date of birth, ID band Patient awake    Reviewed: Allergy & Precautions, H&P , NPO status , Patient's Chart, lab work & pertinent test results  History of Anesthesia Complications (+) PONV and history of anesthetic complications  Airway Mallampati: III  TM Distance: >3 FB Neck ROM: Full    Dental   Patient has cleft lip/palate, has device to cover this, and it stays in:   Pulmonary neg pulmonary ROS, asthma    Pulmonary exam normal breath sounds clear to auscultation       Cardiovascular negative cardio ROS Normal cardiovascular exam Rhythm:Regular Rate:Normal     Neuro/Psych negative neurological ROS  negative psych ROS   GI/Hepatic negative GI ROS, Neg liver ROS,,,  Endo/Other  negative endocrine ROS    Renal/GU negative Renal ROS  negative genitourinary   Musculoskeletal negative musculoskeletal ROS (+)    Abdominal   Peds negative pediatric ROS (+)  Hematology negative hematology ROS (+)   Anesthesia Other Findings Leiden factor V heterozygous  Reproductive/Obstetrics negative OB ROS                             Anesthesia Physical Anesthesia Plan  ASA: 3  Anesthesia Plan: MAC   Post-op Pain Management:    Induction: Intravenous  PONV Risk Score and Plan:   Airway Management Planned: Natural Airway and Nasal Cannula  Additional Equipment:   Intra-op Plan:   Post-operative Plan:   Informed Consent: I have reviewed the patients History and Physical, chart, labs and discussed the procedure including the risks, benefits and alternatives for the proposed anesthesia with the patient or authorized representative who has indicated his/her understanding and acceptance.     Dental Advisory Given  Plan Discussed with: Anesthesiologist, CRNA and Surgeon  Anesthesia Plan Comments: (Patient consented for risks of anesthesia  including but not limited to:  - adverse reactions to medications - damage to eyes, teeth, lips or other oral mucosa - nerve damage due to positioning  - sore throat or hoarseness - Damage to heart, brain, nerves, lungs, other parts of body or loss of life  Patient voiced understanding.)       Anesthesia Quick Evaluation

## 2023-02-10 ENCOUNTER — Ambulatory Visit: Payer: No Typology Code available for payment source | Admitting: Anesthesiology

## 2023-02-10 ENCOUNTER — Encounter: Payer: Self-pay | Admitting: Ophthalmology

## 2023-02-10 ENCOUNTER — Other Ambulatory Visit: Payer: Self-pay

## 2023-02-10 ENCOUNTER — Encounter
Admission: RE | Disposition: A | Discharge status: Home / Self Care | Payer: Self-pay | Source: Home / Self Care | Attending: Ophthalmology

## 2023-02-10 ENCOUNTER — Ambulatory Visit
Admission: RE | Admit: 2023-02-10 | Discharge: 2023-02-10 | Disposition: A | Discharge status: Home / Self Care | Payer: No Typology Code available for payment source | Attending: Ophthalmology | Admitting: Ophthalmology

## 2023-02-10 DIAGNOSIS — D6851 Activated protein C resistance: Secondary | ICD-10-CM | POA: Diagnosis not present

## 2023-02-10 DIAGNOSIS — J45909 Unspecified asthma, uncomplicated: Secondary | ICD-10-CM | POA: Insufficient documentation

## 2023-02-10 DIAGNOSIS — H2512 Age-related nuclear cataract, left eye: Secondary | ICD-10-CM | POA: Diagnosis not present

## 2023-02-10 HISTORY — DX: Other complications of anesthesia, initial encounter: T88.59XA

## 2023-02-10 HISTORY — DX: Activated protein C resistance: D68.51

## 2023-02-10 HISTORY — DX: Other specified postprocedural states: Z98.890

## 2023-02-10 HISTORY — PX: CATARACT EXTRACTION W/PHACO: SHX586

## 2023-02-10 SURGERY — PHACOEMULSIFICATION, CATARACT, WITH IOL INSERTION
Anesthesia: Monitor Anesthesia Care | Site: Eye | Laterality: Left

## 2023-02-10 MED ORDER — MOXIFLOXACIN HCL 0.5 % OP SOLN
OPHTHALMIC | Status: DC | PRN
  Administered 2023-02-10: .2 mL via OPHTHALMIC

## 2023-02-10 MED ORDER — SIGHTPATH DOSE#1 NA HYALUR & NA CHOND-NA HYALUR IO KIT
PACK | INTRAOCULAR | Status: DC | PRN
  Administered 2023-02-10: 1 via OPHTHALMIC

## 2023-02-10 MED ORDER — BRIMONIDINE TARTRATE-TIMOLOL 0.2-0.5 % OP SOLN
OPHTHALMIC | Status: DC | PRN
  Administered 2023-02-10: 1 [drp] via OPHTHALMIC

## 2023-02-10 MED ORDER — SIGHTPATH DOSE#1 BSS IO SOLN
INTRAOCULAR | Status: DC | PRN
  Administered 2023-02-10: 2 mL

## 2023-02-10 MED ORDER — MIDAZOLAM HCL 2 MG/2ML IJ SOLN
INTRAMUSCULAR | Status: DC | PRN
  Administered 2023-02-10 (×2): 1 mg via INTRAVENOUS

## 2023-02-10 MED ORDER — SIGHTPATH DOSE#1 BSS IO SOLN
INTRAOCULAR | Status: DC | PRN
  Administered 2023-02-10: 48 mL via OPHTHALMIC

## 2023-02-10 MED ORDER — TETRACAINE HCL 0.5 % OP SOLN
1.0000 [drp] | OPHTHALMIC | Status: DC | PRN
  Administered 2023-02-10 (×3): 1 [drp] via OPHTHALMIC

## 2023-02-10 MED ORDER — FENTANYL CITRATE (PF) 100 MCG/2ML IJ SOLN
INTRAMUSCULAR | Status: DC | PRN
  Administered 2023-02-10: 50 ug via INTRAVENOUS

## 2023-02-10 MED ORDER — ONDANSETRON HCL 4 MG/2ML IJ SOLN
INTRAMUSCULAR | Status: DC | PRN
  Administered 2023-02-10: 4 mg via INTRAVENOUS

## 2023-02-10 MED ORDER — SIGHTPATH DOSE#1 BSS IO SOLN
INTRAOCULAR | Status: DC | PRN
  Administered 2023-02-10: 15 mL via INTRAOCULAR

## 2023-02-10 MED ORDER — ARMC OPHTHALMIC DILATING DROPS
1.0000 | OPHTHALMIC | Status: DC | PRN
  Administered 2023-02-10 (×3): 1 via OPHTHALMIC

## 2023-02-10 SURGICAL SUPPLY — 20 items
CANNULA ANT/CHMB 27G (MISCELLANEOUS) IMPLANT
CANNULA ANT/CHMB 27GA (MISCELLANEOUS) IMPLANT
CATARACT SUITE SIGHTPATH (MISCELLANEOUS) ×1 IMPLANT
FEE CATARACT SUITE SIGHTPATH (MISCELLANEOUS) ×1 IMPLANT
GLOVE SRG 8 PF TXTR STRL LF DI (GLOVE) ×1 IMPLANT
GLOVE SURG ENC TEXT LTX SZ7.5 (GLOVE) ×1 IMPLANT
GLOVE SURG GAMMEX PI TX LF 7.5 (GLOVE) IMPLANT
GLOVE SURG UNDER POLY LF SZ8 (GLOVE) ×1
LENS IOL CLRN VT TRC 4 12.5 IMPLANT
LENS IOL VIVITY TORIC 12.5 ×1 IMPLANT
NDL FILTER BLUNT 18X1 1/2 (NEEDLE) ×1 IMPLANT
NDL RETROBULBAR .5 NSTRL (NEEDLE) IMPLANT
NEEDLE FILTER BLUNT 18X1 1/2 (NEEDLE) ×1 IMPLANT
PACK VIT ANT 23G (MISCELLANEOUS) IMPLANT
RING MALYGIN 7.0 (MISCELLANEOUS) IMPLANT
SUT ETHILON 10-0 CS-B-6CS-B-6 (SUTURE)
SUT VICRYL  9 0 (SUTURE)
SUT VICRYL 9 0 (SUTURE) IMPLANT
SUTURE EHLN 10-0 CS-B-6CS-B-6 (SUTURE) IMPLANT
SYR 3ML LL SCALE MARK (SYRINGE) ×1 IMPLANT

## 2023-02-10 NOTE — Anesthesia Postprocedure Evaluation (Signed)
Anesthesia Post Note  Patient: Elizabeth Dudley  Procedure(s) Performed: CATARACT EXTRACTION PHACO AND INTRAOCULAR LENS PLACEMENT (IOC) LEFT  CLAREON VIVITY TORIC LENS 6.49 00:30.8 (Left: Eye)  Patient location during evaluation: PACU Anesthesia Type: MAC Level of consciousness: awake and alert Pain management: pain level controlled Vital Signs Assessment: post-procedure vital signs reviewed and stable Respiratory status: spontaneous breathing, nonlabored ventilation, respiratory function stable and patient connected to nasal cannula oxygen Cardiovascular status: stable and blood pressure returned to baseline Postop Assessment: no apparent nausea or vomiting Anesthetic complications: no   No notable events documented.   Last Vitals:  Vitals:   02/10/23 1117 02/10/23 1122  BP: 119/72 135/70  Pulse: 63 64  Resp: 20 12  Temp: (!) 36.4 C (!) 36.4 C  SpO2: 99% 100%    Last Pain:  Vitals:   02/10/23 1122  TempSrc:   PainSc: 0-No pain                 Lashannon Bresnan C Jaisha Villacres

## 2023-02-10 NOTE — Op Note (Signed)
LOCATION:  [facility]   PREOPERATIVE DIAGNOSIS:  Nuclear sclerotic cataract of the left eye.  H25.12  POSTOPERATIVE DIAGNOSIS:  Nuclear sclerotic cataract of the left eye.   PROCEDURE:  Phacoemulsification with Toric posterior chamber intraocular lens placement of the left eye.  Ultrasound time: Procedure(s): CATARACT EXTRACTION PHACO AND INTRAOCULAR LENS PLACEMENT (IOC) LEFT  CLAREON VIVITY TORIC LENS 6.49 00:30.8 (Left)  LENS:   Implant Name Type Inv. Item Serial No. Manufacturer Lot No. LRB No. Used Action  Clareon Vivity Toric IOL 12.5 D Intraocular Lens  [id number] ALCON  Left 1 Implanted     [id number] Toric intraocular lens with 2.25 diopters of cylindrical power with axis orientation at 10 degrees.     SURGEON:  Deirdre Evener, MD   ANESTHESIA:  Topical with tetracaine drops and 2% Xylocaine jelly, augmented with 1% preservative-free intracameral lidocaine.  COMPLICATIONS:  None.   DESCRIPTION OF PROCEDURE:  The patient was identified in the holding room and transported to the operating suite and placed in the supine position under the operating microscope.  The left eye was identified as the operative eye, and it was prepped and draped in the usual sterile ophthalmic fashion.    A clear-corneal paracentesis incision was made at the 1:30 position.  0.5 ml of preservative-free 1% lidocaine was injected into the anterior chamber. The anterior chamber was filled with Viscoat.  A 2.4 millimeter near clear corneal incision was then made at the 10:30 position.  A cystotome and capsulorrhexis forceps were then used to make a curvilinear capsulorrhexis.  Hydrodissection and hydrodelineation were then performed using balanced salt solution.   Phacoemulsification was then used in stop and chop fashion to remove the lens, nucleus and epinucleus.  The remaining cortex was aspirated using the irrigation and aspiration handpiece.  Provisc viscoelastic was then placed into the  capsular bag to distend it for lens placement.  The Verion digital marker was used to align the implant at the intended axis.   A Toric lens was then injected into the capsular bag.  It was rotated clockwise until the axis marks on the lens were approximately 15 degrees in the counterclockwise direction to the intended alignment.  The viscoelastic was aspirated from the eye using the irrigation aspiration handpiece.  Then, a Koch spatula through the sideport incision was used to rotate the lens in a clockwise direction until the axis markings of the intraocular lens were lined up with the Verion alignment.  Balanced salt solution was then used to hydrate the wounds. Vigamox 0.2 ml of a 1mg  per ml solution was injected into the anterior chamber for a dose of 0.2 mg of intracameral antibiotic at the completion of the case.    The eye was noted to have a physiologic pressure and there was no wound leak noted.   Timolol and Brimonidine drops were applied to the eye.  The patient was taken to the recovery room in stable condition having had no complications of anesthesia or surgery.  Elizabeth Dudley 02/10/2023, 11:16 AM

## 2023-02-10 NOTE — Transfer of Care (Signed)
Immediate Anesthesia Transfer of Care Note  Patient: Elizabeth Dudley  Procedure(s) Performed: CATARACT EXTRACTION PHACO AND INTRAOCULAR LENS PLACEMENT (IOC) LEFT  CLAREON VIVITY TORIC LENS 6.49 00:30.8 (Left: Eye)  Patient Location: PACU  Anesthesia Type: MAC  Level of Consciousness: awake, alert  and patient cooperative  Airway and Oxygen Therapy: Patient Spontanous Breathing and Patient connected to supplemental oxygen  Post-op Assessment: Post-op Vital signs reviewed, Patient's Cardiovascular Status Stable, Respiratory Function Stable, Patent Airway and No signs of Nausea or vomiting  Post-op Vital Signs: Reviewed and stable  Complications: No notable events documented.

## 2023-02-10 NOTE — H&P (Signed)
  [facility]   Primary Care Physician:  Merri Brunette, MD Ophthalmologist: Dr. Lockie Mola  Pre-Procedure History & Physical: HPI:  Elizabeth Dudley is a 62 y.o. female here for ophthalmic surgery.   Past Medical History:  Diagnosis Date   Complication of anesthesia    Factor 5 Leiden mutation, heterozygous (HCC)    PONV (postoperative nausea and vomiting)     Past Surgical History:  Procedure Laterality Date   cleft lip     MASTECTOMY     TONSILLECTOMY      Prior to Admission medications   Medication Sig Start Date End Date Taking? Authorizing Provider  budesonide-formoterol (SYMBICORT) 80-4.5 MCG/ACT inhaler Inhale 2 puffs into the lungs 2 (two) times daily.     Yes [provider]  cholecalciferol (VITAMIN D) 1000 UNITS tablet Take 1,000 Units by mouth daily.     Yes [provider]  fluticasone (FLONASE) 50 MCG/ACT nasal spray Place 1 spray into the nose daily.     Yes [provider]  Multiple Vitamins-Minerals (MULTIVITAMIN WITH MINERALS) tablet Take 1 tablet by mouth daily.     Yes [provider]  Omega-3 Fatty Acids (FISH OIL PO) Take 1 capsule by mouth every morning.   Yes [provider]  albuterol (PROVENTIL) (5 MG/ML) 0.5% nebulizer solution Take 2.5 mg by nebulization every 6 (six) hours as needed.      [provider]    Allergies as of 12/21/2022 - Review Complete 01/17/2014  Allergen Reaction Noted   Cephalexin  08/18/2011    History reviewed. No pertinent family history.  Social History   Socioeconomic History   Marital status: Single    Spouse name: Not on file   Number of children: Not on file   Years of education: Not on file   Highest education level: Not on file  Occupational History   Not on file  Tobacco Use   Smoking status: Never   Smokeless tobacco: Never  Substance and Sexual Activity   Alcohol use: No   Drug use: No   Sexual activity: Not on file  Other Topics  Concern   Not on file  Social History Narrative   Not on file   Social Determinants of Health   Financial Resource Strain: Not on file  Food Insecurity: Not on file  Transportation Needs: Not on file  Physical Activity: Not on file  Stress: Not on file  Social Connections: Not on file  Intimate Partner Violence: Not on file    Review of Systems: See HPI, otherwise negative ROS  Physical Exam: Ht 5\' 4"  (1.626 m)   Wt 58.1 kg   BMI 21.97 kg/m  General:   Alert,  pleasant and cooperative in NAD Head:  Normocephalic and atraumatic. Lungs:  Clear to auscultation.    Heart:  Regular rate and rhythm.   Impression/Plan: Elizabeth Dudley is here for ophthalmic surgery.  Risks, benefits, limitations, and alternatives regarding ophthalmic surgery have been reviewed with the patient.  Questions have been answered.  All parties agreeable.   Lockie Mola, MD  02/10/2023, 10:34 AM

## 2023-02-10 NOTE — Discharge Instructions (Signed)

## 2023-02-11 ENCOUNTER — Encounter: Payer: Self-pay | Admitting: Ophthalmology

## 2023-02-12 ENCOUNTER — Encounter: Payer: Self-pay | Admitting: Ophthalmology

## 2023-02-16 ENCOUNTER — Encounter: Payer: Self-pay | Admitting: Ophthalmology

## 2023-02-16 NOTE — Anesthesia Preprocedure Evaluation (Addendum)
Anesthesia Evaluation  Patient identified by MRN, date of birth, ID band Patient awake    Reviewed: Allergy & Precautions, H&P , NPO status , Patient's Chart, lab work & pertinent test results  History of Anesthesia Complications (+) PONV and history of anesthetic complications  Airway Mallampati: III  TM Distance: >3 FB Neck ROM: Full    Dental       Patient has cleft lip/palate, has device to cover this, and it stays  :   Pulmonary neg pulmonary ROS, asthma    Pulmonary exam normal breath sounds clear to auscultation       Cardiovascular negative cardio ROS Normal cardiovascular exam Rhythm:Regular Rate:Normal     Neuro/Psych negative neurological ROS  negative psych ROS   GI/Hepatic negative GI ROS, Neg liver ROS,,,  Endo/Other  negative endocrine ROS    Renal/GU negative Renal ROS  negative genitourinary   Musculoskeletal negative musculoskeletal ROS (+)    Abdominal   Peds negative pediatric ROS (+)  Hematology negative hematology ROS (+)   Anesthesia Other Findings Factor 5 Leiden mutation, heterozygous  PONV (postoperative nausea and vomiting)  Asthma    Reproductive/Obstetrics negative OB ROS                              Anesthesia Physical Anesthesia Plan  ASA: 3  Anesthesia Plan: MAC   Post-op Pain Management:    Induction: Intravenous  PONV Risk Score and Plan:   Airway Management Planned: Natural Airway and Nasal Cannula  Additional Equipment:   Intra-op Plan:   Post-operative Plan:   Informed Consent: I have reviewed the patients History and Physical, chart, labs and discussed the procedure including the risks, benefits and alternatives for the proposed anesthesia with the patient or authorized representative who has indicated his/her understanding and acceptance.     Dental Advisory Given  Plan Discussed with: Anesthesiologist, CRNA and  Surgeon  Anesthesia Plan Comments: (Patient consented for risks of anesthesia including but not limited to:  - adverse reactions to medications - damage to eyes, teeth, lips or other oral mucosa - nerve damage due to positioning  - sore throat or hoarseness - Damage to heart, brain, nerves, lungs, other parts of body or loss of life  Patient voiced understanding.)        Anesthesia Quick Evaluation

## 2023-02-18 NOTE — Discharge Instructions (Signed)

## 2023-02-24 ENCOUNTER — Encounter: Payer: Self-pay | Admitting: Ophthalmology

## 2023-02-24 ENCOUNTER — Other Ambulatory Visit: Payer: Self-pay

## 2023-02-24 ENCOUNTER — Ambulatory Visit: Payer: PRIVATE HEALTH INSURANCE | Admitting: Anesthesiology

## 2023-02-24 ENCOUNTER — Encounter
Admission: RE | Disposition: A | Discharge status: Home / Self Care | Payer: Self-pay | Source: Home / Self Care | Attending: Ophthalmology

## 2023-02-24 ENCOUNTER — Ambulatory Visit
Admission: RE | Admit: 2023-02-24 | Discharge: 2023-02-24 | Disposition: A | Discharge status: Home / Self Care | Payer: PRIVATE HEALTH INSURANCE | Attending: Ophthalmology | Admitting: Ophthalmology

## 2023-02-24 DIAGNOSIS — H2511 Age-related nuclear cataract, right eye: Secondary | ICD-10-CM | POA: Insufficient documentation

## 2023-02-24 DIAGNOSIS — D6851 Activated protein C resistance: Secondary | ICD-10-CM | POA: Insufficient documentation

## 2023-02-24 DIAGNOSIS — J45909 Unspecified asthma, uncomplicated: Secondary | ICD-10-CM | POA: Diagnosis not present

## 2023-02-24 HISTORY — DX: Unspecified cleft palate with unilateral cleft lip: Q37.9

## 2023-02-24 HISTORY — DX: Unspecified asthma, uncomplicated: J45.909

## 2023-02-24 HISTORY — PX: CATARACT EXTRACTION W/PHACO: SHX586

## 2023-02-24 SURGERY — PHACOEMULSIFICATION, CATARACT, WITH IOL INSERTION
Anesthesia: Monitor Anesthesia Care | Laterality: Right

## 2023-02-24 MED ORDER — SIGHTPATH DOSE#1 NA HYALUR & NA CHOND-NA HYALUR IO KIT
PACK | INTRAOCULAR | Status: DC | PRN
  Administered 2023-02-24: 1 via OPHTHALMIC

## 2023-02-24 MED ORDER — MIDAZOLAM HCL 2 MG/2ML IJ SOLN
INTRAMUSCULAR | Status: DC | PRN
  Administered 2023-02-24: 1 mg via INTRAVENOUS
  Administered 2023-02-24: 2 mg via INTRAVENOUS

## 2023-02-24 MED ORDER — LACTATED RINGERS IV SOLN: INTRAVENOUS | Status: DC

## 2023-02-24 MED ORDER — ARMC OPHTHALMIC DILATING DROPS
1.0000 | OPHTHALMIC | Status: DC | PRN
  Administered 2023-02-24 (×3): 1 via OPHTHALMIC

## 2023-02-24 MED ORDER — TETRACAINE HCL 0.5 % OP SOLN
1.0000 [drp] | OPHTHALMIC | Status: DC | PRN
  Administered 2023-02-24 (×3): 1 [drp] via OPHTHALMIC

## 2023-02-24 MED ORDER — SIGHTPATH DOSE#1 BSS IO SOLN
INTRAOCULAR | Status: DC | PRN
  Administered 2023-02-24: 15 mL via INTRAOCULAR

## 2023-02-24 MED ORDER — BRIMONIDINE TARTRATE-TIMOLOL 0.2-0.5 % OP SOLN
OPHTHALMIC | Status: DC | PRN
  Administered 2023-02-24: 1 [drp] via OPHTHALMIC

## 2023-02-24 MED ORDER — SIGHTPATH DOSE#1 BSS IO SOLN
INTRAOCULAR | Status: DC | PRN
  Administered 2023-02-24: 46 mL via OPHTHALMIC

## 2023-02-24 MED ORDER — ONDANSETRON HCL 4 MG/2ML IJ SOLN
4.0000 mg | Freq: Once | INTRAMUSCULAR | Status: AC
  Administered 2023-02-24: 4 mg via INTRAVENOUS

## 2023-02-24 MED ORDER — SIGHTPATH DOSE#1 BSS IO SOLN
INTRAOCULAR | Status: DC | PRN
  Administered 2023-02-24: 2 mL

## 2023-02-24 MED ORDER — MOXIFLOXACIN HCL 0.5 % OP SOLN
OPHTHALMIC | Status: DC | PRN
  Administered 2023-02-24: .2 mL via OPHTHALMIC

## 2023-02-24 MED ORDER — SODIUM CHLORIDE 0.9% FLUSH
INTRAVENOUS | Status: DC | PRN
  Administered 2023-02-24: 10 mL via INTRAVENOUS

## 2023-02-24 SURGICAL SUPPLY — 10 items
CATARACT SUITE SIGHTPATH (MISCELLANEOUS) ×1 IMPLANT
FEE CATARACT SUITE SIGHTPATH (MISCELLANEOUS) ×1 IMPLANT
GLOVE SRG 8 PF TXTR STRL LF DI (GLOVE) ×1 IMPLANT
GLOVE SURG ENC TEXT LTX SZ7.5 (GLOVE) ×1 IMPLANT
GLOVE SURG UNDER POLY LF SZ8 (GLOVE) ×1
LENS IOL CLRN VT TRC 3 13.5 IMPLANT
LENS IOL VIVITY TORIC 13.5 ×1 IMPLANT
NDL FILTER BLUNT 18X1 1/2 (NEEDLE) ×1 IMPLANT
NEEDLE FILTER BLUNT 18X1 1/2 (NEEDLE) ×1 IMPLANT
SYR 3ML LL SCALE MARK (SYRINGE) ×1 IMPLANT

## 2023-02-24 NOTE — Transfer of Care (Signed)
Immediate Anesthesia Transfer of Care Note  Patient: Elizabeth Dudley  Procedure(s) Performed: CATARACT EXTRACTION PHACO AND INTRAOCULAR LENS PLACEMENT (IOC) RIGHT  CLAREON VIVITY TORIC LENS 5.53 00:34.2 (Right)  Patient Location: PACU  Anesthesia Type:MAC  Level of Consciousness: awake, alert , and oriented  Airway & Oxygen Therapy: Patient Spontanous Breathing  Post-op Assessment: Report given to RN and Post -op Vital signs reviewed and stable  Post vital signs: Reviewed and stable  Last Vitals: See flow sheet fior PACU temp  Vitals Value Taken Time  BP 122/64 02/24/23 1243  Temp    Pulse 66 02/24/23 1244  Resp 15 02/24/23 1244  SpO2 100 % 02/24/23 1244  Vitals shown include unvalidated device data.  Last Pain:  Vitals:   02/24/23 1113  TempSrc: Temporal  PainSc: 0-No pain         Complications: No notable events documented.

## 2023-02-24 NOTE — Anesthesia Postprocedure Evaluation (Signed)
Anesthesia Post Note  Patient: Elizabeth Dudley  Procedure(s) Performed: CATARACT EXTRACTION PHACO AND INTRAOCULAR LENS PLACEMENT (IOC) RIGHT  CLAREON VIVITY TORIC LENS 5.53 00:34.2 (Right)  Patient location during evaluation: PACU Anesthesia Type: MAC Level of consciousness: awake and alert Pain management: pain level controlled Vital Signs Assessment: post-procedure vital signs reviewed and stable Respiratory status: spontaneous breathing, nonlabored ventilation, respiratory function stable and patient connected to nasal cannula oxygen Cardiovascular status: stable and blood pressure returned to baseline Postop Assessment: no apparent nausea or vomiting Anesthetic complications: no   No notable events documented.   Last Vitals:  Vitals:   02/24/23 1243 02/24/23 1249  BP: 122/64 114/66  Pulse: 68 (!) 57  Resp: 18 20  Temp: (!) 36.3 C (!) 36.4 C  SpO2: 100% 100%    Last Pain:  Vitals:   02/24/23 1249  TempSrc:   PainSc: 0-No pain                 Kazandra Forstrom C Sherrell Farish

## 2023-02-24 NOTE — Op Note (Signed)
  LOCATION:  [facility]   PREOPERATIVE DIAGNOSIS:  Nuclear sclerotic cataract of the right eye.  H25.11   POSTOPERATIVE DIAGNOSIS:  Nuclear sclerotic cataract of the right eye.   PROCEDURE:  Phacoemulsification with Toric posterior chamber intraocular lens placement of the right eye.  Ultrasound time: Procedure(s): CATARACT EXTRACTION PHACO AND INTRAOCULAR LENS PLACEMENT (IOC) RIGHT  CLAREON VIVITY TORIC LENS 5.53 00:34.2 (Right)  LENS:   Implant Name Type Inv. Item Serial No. Manufacturer Lot No. LRB No. Used Action  LENS IOL VIVITY TORIC 13.5 - [id number]  LENS IOL VIVITY TORIC 13.5 [id number] [employer]  Right 1 Implanted     [id number] 13.5 Toric intraocular lens with 1.5 diopters of cylindrical power with axis orientation at 10 degrees.   SURGEON:  Deirdre Evener, MD   ANESTHESIA: Topical with tetracaine drops and 2% Xylocaine jelly, augmented with 1% preservative-free intracameral lidocaine. .   COMPLICATIONS:  None.   DESCRIPTION OF PROCEDURE:  The patient was identified in the holding room and transported to the operating suite and placed in the supine position under the operating microscope.  The right eye was identified as the operative eye, and it was prepped and draped in the usual sterile ophthalmic fashion.    A clear-corneal paracentesis incision was made at the 12:00 position.  0.5 ml of preservative-free 1% lidocaine was injected into the anterior chamber. The anterior chamber was filled with Viscoat.  A 2.4 millimeter near clear corneal incision was then made at the 9:00 position.  A cystotome and capsulorrhexis forceps were then used to make a curvilinear capsulorrhexis.  Hydrodissection and hydrodelineation were then performed using balanced salt solution.   Phacoemulsification was then used in stop and chop fashion to remove the lens, nucleus and epinucleus.  The remaining cortex was aspirated using the irrigation and aspiration handpiece.  Provisc  viscoelastic was then placed into the capsular bag to distend it for lens placement.  The Verion digital marker was used to align the implant at the intended axis.   A Toric lens was then injected into the capsular bag.  It was rotated clockwise until the axis marks on the lens were approximately 15 degrees in the counterclockwise direction to the intended alignment.  The viscoelastic was aspirated from the eye using the irrigation aspiration handpiece.  Then, a Koch spatula through the sideport incision was used to rotate the lens in a clockwise direction until the axis markings of the intraocular lens were lined up with the Verion alignment.  Balanced salt solution was then used to hydrate the wounds. Cefuroxime 0.1 ml of a 10mg /ml solution was injected into the anterior chamber for a dose of 1 mg of intracameral antibiotic at the completion of the case.    The eye was noted to have a physiologic pressure and there was no wound leak noted.   Timolol and Brimonidine drops were applied to the eye.  The patient was taken to the recovery room in stable condition having had no complications of anesthesia or surgery.  Nimrat Woolworth 02/24/2023, 12:41 PM

## 2023-02-24 NOTE — H&P (Signed)
[facility]   Primary Care Physician:  Merri Brunette, MD Ophthalmologist: Dr. Lockie Mola  Pre-Procedure History & Physical: HPI:  Elizabeth Dudley is a 62 y.o. female here for ophthalmic surgery.   Past Medical History:  Diagnosis Date   Asthma    Cleft palate and cleft lip    Complication of anesthesia    Factor 5 Leiden mutation, heterozygous (HCC)    PONV (postoperative nausea and vomiting)     Past Surgical History:  Procedure Laterality Date   CATARACT EXTRACTION W/PHACO Left 02/10/2023   Procedure: CATARACT EXTRACTION PHACO AND INTRAOCULAR LENS PLACEMENT (IOC) LEFT  CLAREON VIVITY TORIC LENS 6.49 00:30.8;  Surgeon: Lockie Mola, MD;  Location: [facility];  Service: Ophthalmology;  Laterality: Left;   cleft lip     MASTECTOMY     TONSILLECTOMY      Prior to Admission medications   Medication Sig Start Date End Date Taking? Authorizing Provider  albuterol (PROVENTIL) (5 MG/ML) 0.5% nebulizer solution Take 2.5 mg by nebulization every 6 (six) hours as needed.     Yes [provider]  budesonide-formoterol (SYMBICORT) 80-4.5 MCG/ACT inhaler Inhale 2 puffs into the lungs 2 (two) times daily.     Yes [provider]  cholecalciferol (VITAMIN D) 1000 UNITS tablet Take 1,000 Units by mouth daily.     Yes [provider]  fluticasone (FLONASE) 50 MCG/ACT nasal spray Place 1 spray into the nose daily.     Yes [provider]  Multiple Vitamins-Minerals (MULTIVITAMIN WITH MINERALS) tablet Take 1 tablet by mouth daily.     Yes [provider]  Omega-3 Fatty Acids (FISH OIL PO) Take 1 capsule by mouth every morning.   Yes [provider]    Allergies as of 12/21/2022 - Review Complete 01/17/2014  Allergen Reaction Noted   Cephalexin  08/18/2011    History reviewed. No pertinent family history.  Social History   Socioeconomic History   Marital status: Single    Spouse name: Not on file    Number of children: Not on file   Years of education: Not on file   Highest education level: Not on file  Occupational History   Not on file  Tobacco Use   Smoking status: Never   Smokeless tobacco: Never  Substance and Sexual Activity   Alcohol use: No   Drug use: No   Sexual activity: Not on file  Other Topics Concern   Not on file  Social History Narrative   Not on file   Social Determinants of Health   Financial Resource Strain: Not on file  Food Insecurity: Not on file  Transportation Needs: Not on file  Physical Activity: Not on file  Stress: Not on file  Social Connections: Not on file  Intimate Partner Violence: Not on file    Review of Systems: See HPI, otherwise negative ROS  Physical Exam: BP 129/83   Pulse (!) 58   Temp (!) 97.3 F (36.3 C) (Temporal)   Ht 5' 4.02" (1.626 m)   Wt 55.3 kg   SpO2 99%   BMI 20.93 kg/m  General:   Alert,  pleasant and cooperative in NAD Head:  Normocephalic and atraumatic. Lungs:  Clear to auscultation.    Heart:  Regular rate and rhythm.   Impression/Plan: Elizabeth Dudley is here for ophthalmic surgery.  Risks, benefits, limitations, and alternatives regarding ophthalmic surgery have been reviewed with the patient.  Questions have been answered.  All parties agreeable.  Lockie Mola, MD  02/24/2023, 11:29 AM

## 2023-02-25 ENCOUNTER — Encounter: Payer: Self-pay | Admitting: Ophthalmology
